# Patient Record
Sex: Male | Born: 1935 | Race: White | Hispanic: No | Smoking: Former smoker
Health system: Southern US, Community
[De-identification: ages and names within clinical notes are randomized; demographics above are authoritative.]

## PROBLEM LIST (undated history)

## (undated) DIAGNOSIS — C801 Malignant (primary) neoplasm, unspecified: Secondary | ICD-10-CM

## (undated) DIAGNOSIS — J449 Chronic obstructive pulmonary disease, unspecified: Secondary | ICD-10-CM

## (undated) DIAGNOSIS — I714 Abdominal aortic aneurysm, without rupture, unspecified: Secondary | ICD-10-CM

## (undated) DIAGNOSIS — N189 Chronic kidney disease, unspecified: Secondary | ICD-10-CM

## (undated) DIAGNOSIS — M1990 Unspecified osteoarthritis, unspecified site: Secondary | ICD-10-CM

## (undated) HISTORY — DX: Abdominal aortic aneurysm, without rupture, unspecified: I71.40

## (undated) HISTORY — DX: Chronic obstructive pulmonary disease, unspecified: J44.9

## (undated) HISTORY — DX: Abdominal aortic aneurysm, without rupture: I71.4

## (undated) HISTORY — PX: CATARACT EXTRACTION, BILATERAL: SHX1313

## (undated) HISTORY — DX: Malignant (primary) neoplasm, unspecified: C80.1

## (undated) HISTORY — DX: Unspecified osteoarthritis, unspecified site: M19.90

## (undated) HISTORY — DX: Chronic kidney disease, unspecified: N18.9

---

## 1981-07-04 HISTORY — PX: OTHER SURGICAL HISTORY: SHX169

## 1998-08-14 ENCOUNTER — Ambulatory Visit (HOSPITAL_COMMUNITY): Admission: RE | Admit: 1998-08-14 | Discharge: 1998-08-14 | Payer: Self-pay | Admitting: Gastroenterology

## 2003-08-18 ENCOUNTER — Encounter: Admission: RE | Admit: 2003-08-18 | Discharge: 2003-08-18 | Payer: Self-pay | Admitting: Emergency Medicine

## 2003-08-27 ENCOUNTER — Encounter: Admission: RE | Admit: 2003-08-27 | Discharge: 2003-08-27 | Payer: Self-pay | Admitting: Emergency Medicine

## 2005-04-19 ENCOUNTER — Ambulatory Visit: Payer: Self-pay | Admitting: Cardiology

## 2005-05-03 ENCOUNTER — Ambulatory Visit: Payer: Self-pay

## 2006-11-27 ENCOUNTER — Emergency Department (HOSPITAL_COMMUNITY): Admission: EM | Admit: 2006-11-27 | Discharge: 2006-11-27 | Payer: Self-pay | Admitting: Emergency Medicine

## 2006-12-06 ENCOUNTER — Ambulatory Visit (HOSPITAL_COMMUNITY): Admission: RE | Admit: 2006-12-06 | Discharge: 2006-12-06 | Payer: Self-pay | Admitting: Emergency Medicine

## 2006-12-07 ENCOUNTER — Encounter: Admission: RE | Admit: 2006-12-07 | Discharge: 2006-12-07 | Payer: Self-pay | Admitting: Emergency Medicine

## 2006-12-13 ENCOUNTER — Encounter: Admission: RE | Admit: 2006-12-13 | Discharge: 2006-12-13 | Payer: Self-pay | Admitting: Emergency Medicine

## 2008-09-19 ENCOUNTER — Encounter: Admission: RE | Admit: 2008-09-19 | Discharge: 2008-09-19 | Payer: Self-pay | Admitting: Internal Medicine

## 2008-10-16 ENCOUNTER — Ambulatory Visit (HOSPITAL_COMMUNITY): Admission: RE | Admit: 2008-10-16 | Discharge: 2008-10-16 | Payer: Self-pay | Admitting: Urology

## 2008-10-30 ENCOUNTER — Ambulatory Visit: Payer: Self-pay | Admitting: *Deleted

## 2009-02-26 ENCOUNTER — Encounter: Admission: RE | Admit: 2009-02-26 | Discharge: 2009-02-26 | Payer: Self-pay | Admitting: *Deleted

## 2009-02-26 ENCOUNTER — Ambulatory Visit: Payer: Self-pay | Admitting: Vascular Surgery

## 2009-08-13 ENCOUNTER — Ambulatory Visit: Payer: Self-pay | Admitting: Vascular Surgery

## 2010-03-01 ENCOUNTER — Ambulatory Visit: Payer: Self-pay | Admitting: Vascular Surgery

## 2010-07-26 ENCOUNTER — Encounter: Payer: Self-pay | Admitting: *Deleted

## 2010-11-16 NOTE — Assessment & Plan Note (Signed)
OFFICE VISIT   ELIZER, BOSTIC  DOB:  [birth year]                                       02/26/2009  ZOXWR#:[id number]   I saw the patient in the office today for continued followup of his  abdominal aortic aneurysm.  He has been followed by Dr. Madilyn Fireman.  This is  a pleasant 77 year old gentleman who on his most recent followup visit  it was noted that the aneurysm measured 5 cm in maximum diameter.  He  was set up for a 3 month followup CT scan.  He has a complicated medical  history in that he has a colostomy from previous colon cancer.  He also  has a tumor on the right kidney which is followed by Dr. Annabell Howells.  He also  has a history of continued tobacco abuse and smokes a pack per day of  cigarettes.  He denies any history of abdominal or back pain.   REVIEW OF SYSTEMS:  He has had no recent chest pain, chest pressure,  palpitations or arrhythmias.  He does admit to dyspnea on exertion.  He  has had no recent productive cough, bronchitis, asthma or wheezing.   PHYSICAL EXAMINATION:  General:  This is a pleasant 77 year old  gentleman who appears his stated age.  Vital signs:  His blood pressure  is 170/79, heart rate is 72.  HEENT:  Unremarkable.  Neck:  Neck is  supple.  I do not detect any carotid bruits.  Lungs:  Are clear  bilaterally to auscultation.  Cardiac:  He has a regular rate and  rhythm.  Abdomen:  Soft and nontender and his aneurysm is palpable and  nontender.  He has a colostomy.  He has palpable femoral and pedal  pulses bilaterally.  He has no evidence of atheroembolic disease.   I did review his CT scan from today and this does show that the aneurysm  today measures only 4.2 cm in maximum diameter.  Thus it is smaller than  originally noted.  He does have a right renal mass which is known.  This  has been slowly enlarging since the original study in February of 2005.   Of note, if this aneurysm ever enlarged enough to consider elective  repair certainly we would want to consider endovascular repair given his  colostomy and his age.  However, he does have a fair amount of thrombus  at the neck which may prohibit endovascular repair.   I have recommended a followup ultrasound in 6 months and I will see him  back at that time.  Certainly we would not consider elective repair  unless the aneurysm enlarged to greater than 5.5 cm.  We have also  discussed the importance of tobacco cessation.  Finally, I have asked  him to be sure to follow up with Dr. Annabell Howells who follows his renal tumor.   Di Kindle. Edilia Bo, M.D.  Electronically Signed   CSD/MEDQ  D:  02/26/2009  T:  02/27/2009  Job:  [id number]   cc:   Georgann Housekeeper, MD

## 2010-11-16 NOTE — Procedures (Signed)
DUPLEX ULTRASOUND OF ABDOMINAL AORTA   INDICATION:  Follow up known abdominal aortic aneurysm.   HISTORY:  Diabetes:  No.  Cardiac:  No.  Hypertension:  No.  Smoking:  Yes.  Connective Tissue Disorder:  Family History:  Previous Surgery:   DUPLEX EXAM:         AP (cm)                   TRANSVERSE (cm)  Proximal             2.86 cm                   2.87 cm  Mid                  3.75 cm                   3.62 cm  Distal               2.12 cm                   1.96 cm  Right Iliac          1.03 cm                   1.32 cm  Left Iliac           1.32 cm                   1.07 cm   PREVIOUS:  Date:  AP:  4.2 per CT  TRANSVERSE:   IMPRESSION:  Abdominal aortic aneurysm noted with the largest  measurement of 3.75 cm X 3.62 cm.   ___________________________________________  Di Kindle. Edilia Bo, M.D.   MG/MEDQ  D:  08/13/2009  T:  08/13/2009  Job:  [id number]

## 2010-11-16 NOTE — Procedures (Signed)
DUPLEX ULTRASOUND OF ABDOMINAL AORTA   INDICATION:  Follow up abdominal aortic aneurysm.   HISTORY:  Diabetes:  No.  Cardiac:  No.  Hypertension:  No.  Smoking:  Yes.  Connective Tissue Disorder:  Family History:  Family history of connective tissue disorder.  Previous Surgery:   DUPLEX EXAM:         AP (cm)                   TRANSVERSE (cm)  Proximal             3.22 cm                   3.06 cm  Mid                  3.58 cm                   3.55 cm  Distal               3.25 cm                   3.09 cm  Right Iliac          1.26 cm                   Not visualized  Left Iliac           0.85 cm                   Not visualized   PREVIOUS:  Date:  AP:  3.75  TRANSVERSE:  3.62   IMPRESSION:  Abdominal aortic aneurysm noted with largest measurement of  3.58 X 3.55 cm.   ___________________________________________  Di Kindle. Edilia Bo, M.D.   EM/MEDQ  D:  03/01/2010  T:  03/01/2010  Job:  [id number]

## 2010-11-16 NOTE — Consult Note (Signed)
VASCULAR SURGERY CONSULTATION   Shane [name], Shane [name]  DOB:  1920-01-23                                       10/30/2008  ZOXWR#:[id number]   REFERRING PHYSICIAN:  Georgann Housekeeper, MD   REFERRAL DIAGNOSIS:  Abdominal aortic aneurysm.   HISTORY:  The patient is a retired 77 year old gentleman who has a known  abdominal aortic aneurysm.  His most recent abdominal ultrasound was  carried out 10/16/2008 revealing his aneurysm measured 5 cm in maximal  diameter.  This compares to a CT scan performed in March of this year at  4.7 cm at maximal diameter.  Iliac arteries measure 1.4 on the right and  1.5 on the left.  The patient also has a solid mass emanating from the  lower pole of the right kidney followed by Dr. Annabell Howells.   The patient feels generally well.  He has no abdominal complaints.  He  underwent resection of a colon cancer and has a colostomy.   Cardiovascular risk factors include his advanced age and a history of  tobacco use.   PAST MEDICAL HISTORY:  1. COPD.  2. Colon CA.  3. Right kidney lesion.  4. Tobacco abuse.  5. BPH.   MEDICATIONS:  1. Aspirin 81 mg daily.  2. Multivitamin 1 tablet daily.   SOCIAL HISTORY:  The patient is widowed.  He has one son.  He is retired  from the [employer].  Smokes one to two cigarettes daily.  No  regular alcohol use.   FAMILY HISTORY:  Mother deceased age 75 and father deceased age 19,  complications of age.  Denies family history of cardiovascular disease,  stroke or diabetes.   REVIEW OF SYSTEMS:  Refer to patient encounter form.  The patient  describes arthritic joint discomfort.   PHYSICAL EXAM:  General:  A well-appearing 77 year old male.  Alert and  oriented.  No distress.  Vital signs:  BP 158/75, pulse 69 per minute.  HEENT:  Mouth and throat are clear.  Normocephalic.  Extraocular  movements intact.  Neck:  Supple.  No thyromegaly or adenopathy.  Chest:  Equal entry bilaterally.  Distant breath  sounds.  No rales or rhonchi.  Cardiovascular:  No carotid bruits.  Normal heart sounds without  murmurs.  No gallops or rubs.  Regular rate and rhythm.  Abdomen:  Soft,  nontender.  Left lower quadrant colostomy.  No hernias.  No organomegaly  or masses.  Lower extremities:  No ankle edema.  Full range of motion.  Femoral pulses 2+ bilaterally.  1+ popliteal, posterior tibial and  dorsalis pedis pulse.  Neurological:  Cranial nerves intact.  Strength  equal bilaterally.  1+ reflexes.  Skin:  Intact without rash or  ulceration.   IMPRESSION:  1. Five cm abdominal aortic aneurysm.  2. Colon cancer with colostomy.  3. Chronic obstructive pulmonary disease.  4. Right renal lesion.  5. Tobacco use.   RECOMMENDATIONS:  The patient has a 5 cm abdominal aortic aneurysm.  Recent evidence of growth.  Will plan followup in 3 months with CT scan.  The patient cautioned regarding the onset of any pain to contact the  office.  Chronic medical conditions well-controlled.  The patient  counseled regarding smoking cessation.   Balinda Quails, M.D.  Electronically Signed  PGH/MEDQ  D:  10/30/2008  T:  10/31/2008  Job:  [id number]   cc:   Georgann Housekeeper, MD

## 2011-03-02 ENCOUNTER — Ambulatory Visit (INDEPENDENT_AMBULATORY_CARE_PROVIDER_SITE_OTHER): Payer: Medicare Other

## 2011-03-02 DIAGNOSIS — I714 Abdominal aortic aneurysm, without rupture: Secondary | ICD-10-CM

## 2011-03-16 ENCOUNTER — Encounter: Payer: Self-pay | Admitting: Vascular Surgery

## 2011-03-16 NOTE — Procedures (Unsigned)
DUPLEX ULTRASOUND OF ABDOMINAL AORTA  INDICATION:  Abdominal aortic aneurysm.  HISTORY: Diabetes:  No. Cardiac:  No. Hypertension:  No. Smoking:  Yes. Connective Tissue Disorder: Family History:  Family history of connective tissue disorder. Previous Surgery:  No.  DUPLEX EXAM:         AP (cm)                   TRANSVERSE (cm) Proximal             2.6 cm                    2.4 cm Mid                  2.4 cm                    2.6 cm Distal               3.9 cm                    3.9 cm Right Iliac          1.6 cm                    1.7 cm Left Iliac           Not visualized            Not visualized  PREVIOUS:  Date:  03/01/2010  AP:  3.58  TRANSVERSE:  3.55  IMPRESSION: 1. Aneurysmal dilatation of the distal abdominal aorta with no     significant change in maximum diameter when compared to the     previous exam. 2. Unable to adequately visualize the left common iliac artery due to     overlying bowel gas patterns.  ___________________________________________ Di Kindle. Edilia Bo, M.D.  CH/MEDQ  D:  03/04/2011  T:  03/04/2011  Job:  [id number]

## 2011-09-01 ENCOUNTER — Other Ambulatory Visit: Payer: Self-pay | Admitting: Internal Medicine

## 2011-09-01 DIAGNOSIS — N289 Disorder of kidney and ureter, unspecified: Secondary | ICD-10-CM

## 2011-09-28 ENCOUNTER — Ambulatory Visit: Payer: Medicare Other | Admitting: Vascular Surgery

## 2011-09-28 ENCOUNTER — Other Ambulatory Visit: Payer: Medicare Other

## 2011-09-29 ENCOUNTER — Inpatient Hospital Stay: Admission: RE | Admit: 2011-09-29 | Payer: Medicare Other | Source: Ambulatory Visit

## 2012-01-16 ENCOUNTER — Telehealth: Payer: Self-pay | Admitting: Vascular Surgery

## 2012-01-16 NOTE — Telephone Encounter (Signed)
Message copied by Sara Chu on Mon Jan 16, 2012 11:43 AM ------      Message from: Marcellus Scott      Created: Mon Jan 16, 2012  9:40 AM      Contact: [phone]       Shane [name] wants [place] to call him back to res. His appointment. He can come any day except Wednesday.

## 2012-02-27 ENCOUNTER — Encounter: Payer: Self-pay | Admitting: Vascular Surgery

## 2012-02-29 ENCOUNTER — Ambulatory Visit: Payer: Medicare Other | Admitting: Vascular Surgery

## 2012-03-02 ENCOUNTER — Encounter: Payer: Self-pay | Admitting: Neurosurgery

## 2012-03-06 ENCOUNTER — Ambulatory Visit (INDEPENDENT_AMBULATORY_CARE_PROVIDER_SITE_OTHER): Payer: Medicare Other | Admitting: *Deleted

## 2012-03-06 ENCOUNTER — Encounter: Payer: Self-pay | Admitting: Neurosurgery

## 2012-03-06 ENCOUNTER — Ambulatory Visit (INDEPENDENT_AMBULATORY_CARE_PROVIDER_SITE_OTHER): Payer: Medicare Other | Admitting: Neurosurgery

## 2012-03-06 VITALS — BP 158/68 | HR 58 | Resp 12 | Ht 68.0 in | Wt 153.5 lb

## 2012-03-06 DIAGNOSIS — I714 Abdominal aortic aneurysm, without rupture, unspecified: Secondary | ICD-10-CM

## 2012-03-06 NOTE — Addendum Note (Signed)
Addended by: Sharee Pimple on: 03/06/2012 09:39 AM   Modules accepted: Orders

## 2012-03-06 NOTE — Progress Notes (Signed)
[facility] [place] AAA/PAD/PVD Office Note  CC: Annual AAA duplex Referring Physician: Edilia Bo  History of Present Illness: 78 year old male patient of Dr. Edilia Bo followed for known AAA. The patient denies any unusual abdominal or back pain. Patient has no other vascular issues, no new medical diagnoses or recent surgery.  Past Medical History  Diagnosis Date  . AAA (abdominal aortic aneurysm)   . COPD (chronic obstructive pulmonary disease)   . Cancer     Colon cancer  . Chronic kidney disease   . Arthritis     ROS: [x]  Positive   [ ]  Denies    General: [ ]  Weight loss, [ ]  Fever, [ ]  chills Neurologic: [ ]  Dizziness, [ ]  Blackouts, [ ]  Seizure [ ]  Stroke, [ ]  "Mini stroke", [ ]  Slurred speech, [ ]  Temporary blindness; [ ]  weakness in arms or legs, [ ]  Hoarseness Cardiac: [ ]  Chest pain/pressure, [ ]  Shortness of breath at rest [ ]  Shortness of breath with exertion, [ ]  Atrial fibrillation or irregular heartbeat Vascular: [ ]  Pain in legs with walking, [ ]  Pain in legs at rest, [ ]  Pain in legs at night,  [ ]  Non-healing ulcer, [ ]  Blood clot in vein/DVT,   Pulmonary: [ ]  Home oxygen, [ ]  Productive cough, [ ]  Coughing up blood, [ ]  Asthma,  [ ]  Wheezing Musculoskeletal:  [ ]  Arthritis, [ ]  Low back pain, [ ]  Joint pain Hematologic: [ ]  Easy Bruising, [ ]  Anemia; [ ]  Hepatitis Gastrointestinal: [ ]  Blood in stool, [ ]  Gastroesophageal Reflux/heartburn, [ ]  Trouble swallowing Urinary: [ ]  chronic Kidney disease, [ ]  on HD - [ ]  MWF or [ ]  TTHS, [ ]  Burning with urination, [ ]  Difficulty urinating Skin: [ ]  Rashes, [ ]  Wounds Psychological: [ ]  Anxiety, [ ]  Depression   Social History History  Substance Use Topics  . Smoking status: Current Everyday Smoker  . Smokeless tobacco: Not on file  . Alcohol Use: No    Family History Family History  Problem Relation Age of Onset  . Heart disease Father     Not on File  Current Outpatient  Prescriptions  Medication Sig Dispense Refill  . aspirin 81 MG tablet Take 81 mg by mouth daily.      . Multiple Vitamin (MULTIVITAMIN WITH MINERALS) TABS Take 1 tablet by mouth daily.        Physical Examination  Filed Vitals:   03/06/12 0920  BP: 158/68  Pulse: 58  Resp: 12    Body mass index is 23.34 kg/(m^2).  General:  WDWN in NAD Gait: Normal HEENT: WNL Eyes: Pupils equal Pulmonary: normal non-labored breathing , without Rales, rhonchi,  wheezing Cardiac: RRR, without  Murmurs, rubs or gallops; No carotid bruits Abdomen: soft, NT, no masses Skin: no rashes, ulcers noted Vascular Exam/Pulses: Palpable femoral pulses bilaterally, no abdominal mass is palpated  Extremities without ischemic changes, no Gangrene , no cellulitis; no open wounds;  Musculoskeletal: no muscle wasting or atrophy  Neurologic: A&O X 3; Appropriate Affect ; SENSATION: normal; MOTOR FUNCTION:  moving all extremities equally. Speech is fluent/normal  Non-Invasive Vascular Imaging: AAA duplex today shows a maximum diameter of 3.7 x 3.7 which is a slight decrease from previous one year ago when he was 3.9  ASSESSMENT/PLAN: Asymptomatic AAA, patient will followup in one year with repeat AAA duplex. The patient's questions were encouraged and answered, he is in agreement with this plan.  Lauree Chandler ANP  Clinic M.D.: Early

## 2013-03-05 ENCOUNTER — Encounter: Payer: Self-pay | Admitting: Family

## 2013-03-06 ENCOUNTER — Encounter (INDEPENDENT_AMBULATORY_CARE_PROVIDER_SITE_OTHER): Payer: Medicare Other | Admitting: *Deleted

## 2013-03-06 ENCOUNTER — Ambulatory Visit (INDEPENDENT_AMBULATORY_CARE_PROVIDER_SITE_OTHER): Payer: Medicare Other | Admitting: Family

## 2013-03-06 ENCOUNTER — Encounter: Payer: Self-pay | Admitting: Family

## 2013-03-06 VITALS — BP 133/72 | HR 72 | Resp 16 | Ht 67.0 in | Wt 138.0 lb

## 2013-03-06 DIAGNOSIS — I714 Abdominal aortic aneurysm, without rupture, unspecified: Secondary | ICD-10-CM

## 2013-03-06 NOTE — Patient Instructions (Signed)
Abdominal Aortic Aneurysm  An aneurysm is the enlargement (dilatation), bulging, or ballooning out of part of the wall of a vein or artery. An aortic aneurysm is a bulging in the largest artery of the body. This artery supplies blood from the heart to the rest of the body.  The first part of the aorta is called the thoracic aorta. It leaves the heart, rises (ascends), arches, and goes down (descends) through the chest until it reaches the diaphragm. The diaphragm is the muscular part between the chest and abdomen.  The second part of the aorta is called the abdominal aorta after it has passed the diaphragm and continues down through the abdomen. The abdominal aorta ends where it splits to form the two iliac arteries that go to the legs. Aortic aneurysms can develop anywhere along the length of the aorta. The majority are located along the abdominal aorta. The major concern with an aortic aneurysm is that it can enlarge and rupture. This can cause death unless diagnosed and treated promptly. Aneurysms can also develop blood clots or infections. CAUSES  Many aortic aneurysms are caused by arteriosclerosis. Arteriosclerosis can weaken the aortic wall. The pressure of the blood being pumped through the aorta causes it to balloon out at the site of weakness. Therefore, high blood pressure (hypertension) is associated with aneurysm. Other risk factors include:  Age over 60.  Tobacco use.  Being male.  White race.  Family history of aneurysm.  Less frequent causes of abdominal aortic aneurysms include:  Connective tissue diseases.  Abdominal trauma.  Inflammation of blood vessles (arteritis).  Inherited (congenital) malformations.  Infection. SYMPTOMS  The signs and symptoms of an unruptured aneurysm will partly depend on its size and rate of growth.   Abdominal aortic aneurysms may cause pain. The pain typically has a deep quality as if it is piercing into the person. It is felt most  often in the lower back area. The pain is usually steady but may be relieved by changing your body position.  The person may also become aware of an abnormally prominent pulse in the belly (abdominal pulsation). DIAGNOSIS  An aortic aneurysm may be discovered by chance on physical exam, or on X-ray studies done for other reasons. It may be suspected because of other problems such as back or abdominal pain. The following tests may help identify the problem.  X-rays of the abdomen can show calcium deposits in the aneurysm wall.  CT scanning of the abdomen, particularly with contrast medium, is accurate at showing the exact size and shape of the aneurysm.  Ultrasounds give a clear picture of the size of an aneurysm (about 98% accuracy).  MRI scanning is accurate, but often unnecessary.  An abdominal angiogram shows the source of the major blood vessels arising from the aorta. It reveals the size and extent of any aneurysm. It can also show a clot clinging to the wall of the aneurysm (mural thrombus). TREATMENT  Treating an abdominal aortic aneurysm depends on the size. A rupture of an aneurysm is uncommon when they are less than 5 cm wide (2 inches). Rupture is far more common in aneurysms that are over 6 cm wide (2.4 inches).  Surgical repair is usually recommended for all aneurysms over 6 cm wide (2.4 inches). This depends on the health, age, and other circumstances of the individual. This type of surgery consists of opening the abdomen, removing the aneurysm, and sewing a synthetic graft (similar to a cloth tube) in its place. A   less invasive form of this surgery, using stent grafts, is sometimes recommended.  For most patients, elective repair is recommended for aneurysms between 4 and 6 cm (1.6 and 2.4 inches). Elective means the surgery can be done at your convenience. This should not be put off too long if surgery is recommended.  If you smoke, stop immediately. Smoking is a major risk  factor for enlargement and rupture.  Medications may be used to help decrease complications  these include medicine to lower blood pressure and control cholesterol. HOME CARE INSTRUCTIONS   If you smoke, stop. Do not start smoking.  Take all medications as prescribed.  Your caregiver will tell you when to have your aneurysm rechecked, either by ultrasound or CT scan.  If your caregiver has given you a follow-up appointment, it is very important to keep that appointment. Not keeping the appointment could result in a chronic or permanent injury, pain, or disability. If there is any problem keeping the appointment, you must call back to this facility for assistance. SEEK MEDICAL CARE IF:   You develop mild abdominal pain or pressure.  You are able to feel or perceive your aneurysm, and you sense any change. SEEK IMMEDIATE MEDICAL CARE IF:   You develop severe abdominal pain, or severe pain moving (radiating) to your back.  You suddenly develop cold or blue toes or feet.  You suddenly develop lightheadedness or fainting spells. MAKE SURE YOU:   Understand these instructions.  Will watch your condition.  Will get help right away if you are not doing well or get worse. Document Released: 03/30/2005 Document Revised: 09/12/2011 Document Reviewed: 01/22/2008 Exit[employer] Patient Information 2014 [employer].  

## 2013-03-06 NOTE — Progress Notes (Signed)
[facility] [place]  Established Abdominal Aortic Aneurysm  History of Present Illness  Shane [name] is a 79 y.o. ([birth year]) male patient who had been followed by Dr. Edilia Bo and presents for yearly surveillance follow up for AAA.  Previous studies (Sept., 2013) demonstrate an AAA, measuring 3.7 cm x 3.7 cm.  The patient does not have back or abdominal pain.  The patient is a former smoker. 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 He reports that he does lots of walking, uses his weed eater and mows his lawn. He states he was diagnosed with diabetes about 3 years ago and takes 2 oral medications for this; he will call our office when he gets homes with the names of the medications.  Pt Diabetic: Yes, states in good control Pt smoker: former smoker, quit 8 years ago  Past Medical History  Diagnosis Date  . AAA (abdominal aortic aneurysm)   . COPD (chronic obstructive pulmonary disease)   . Cancer     Colon cancer  . Chronic kidney disease   . Arthritis    No past surgical history on file. Social History History   Social History  . Marital Status: Widowed    Spouse Name: N/A    Number of Children: N/A  . Years of Education: N/A   Occupational History  . Not on file.   Social History Main Topics  . Smoking status: Current Every Day Smoker  . Smokeless tobacco: Not on file  . Alcohol Use: No  . Drug Use: No  . Sexual Activity:    Other Topics Concern  . Not on file   Social History Narrative  . No narrative on file   Family History Family History  Problem Relation Age of Onset  . Heart disease Father     Current Outpatient Prescriptions on File Prior to Visit  Medication Sig Dispense Refill  . aspirin 81 MG tablet Take 81 mg by mouth daily.      . Multiple Vitamin (MULTIVITAMIN WITH MINERALS) TABS Take 1 tablet by mouth daily.       No current facility-administered medications on file prior to visit.   Not on  File  ROS: [x]  Positive   [ ]  Negative   [ ]  All sytems reviewed and are negative  General: Arly.Keller ] Weight loss with recent food poisoning, lost 15 pounds [ ]  Fever, [ ]  chills Neurologic: [ ]  Dizziness, [ ]  Blackouts, [ ]  Seizure [ ]  Stroke, [ ]  "Mini stroke", [ ]  Slurred speech, [ ]  Temporary blindness; [ ]  weakness in arms or legs, [ ]  Hoarseness Cardiac: [ ]  Chest pain/pressure, [ ]  Shortness of breath at rest [ ]  Shortness of breath with exertion, [ ]  Atrial fibrillation or irregular heartbeat Vascular: [ ]  Pain in legs with walking, [ ]  Pain in legs at rest, [ ]  Pain in legs at night,  [ ]  Non-healing ulcer, [ ]  Blood clot in vein/DVT,   Pulmonary: [ ]  Home oxygen, [ ]  Productive cough, [ ]  Coughing up blood, [ ]  Asthma,  [ ]  Wheezing Musculoskeletal:  [ ]  Arthritis, [ ]  Low back pain, [ ]  Joint pain Hematologic: [ ]  Easy Bruising, [ ]  Anemia; [ ]  Hepatitis Gastrointestinal: [ ]  Blood in stool, [ ]  Gastroesophageal Reflux/heartburn, [ ]  Trouble swallowing Urinary: [ ]  chronic Kidney disease, [ ]  on HD - [ ]  MWF or [ ]  TTHS, [ ]  Burning with urination, [ ]   Difficulty urinating Skin: [ ]  Rashes, [ ]  Wounds Psychological: [ ]  Anxiety, [ ]  Depression  Physical Examination  Filed Vitals:   03/06/13 0913  BP: 133/72  Pulse: 72  Resp: 16   Body mass index is 21.61 kg/(m^2).  General: A&O x 3, WD, fit appearing for age 86.  Pulmonary: Sym exp, good air movt, CTAB, no rales, rhonchi, & wheezing.  Cardiac: RRR, Nl S1, S2, no Murmurs, rubs or gallops.  Carotid Bruits Left Right   Negative Positive                             VASCULAR EXAM:                                                                                                         LE Pulses LEFT RIGHT       FEMORAL   palpable   palpable        POPLITEAL   palpable    palpable       POSTERIOR TIBIAL   palpable    palpable        DORSALIS PEDIS      ANTERIOR TIBIAL  palpable    palpable      Gastrointestinal:  soft, NTND, -G/R, - HSM, - masses, - CVAT B.  Musculoskeletal: M/S 5/5 throughout. Extremities without ischemic changes.  Neurologic: CN 2-12 intact. Pain and light touch intact in extremities. Motor exam as listed above. Hard of hearing, wearing hearing aids.  Non-Invasive Vascular Imaging  AAA Duplex (03/06/2013)  Previous size: 3.9 x 3.7  cm (Date: 03/06/2012), 3.9 x 3.9 cm on 03/02/2011.  Current size:  3.9 cm x 3.7 cm(Date: 03/06/2013)  Medical Decision Making  The patient is a 79 y.o. male who presents with: asymptomatic AAA with no increase in size. Right carotid bruit was auscultated, no previous carotid Duplex on file, but he has no history of any TIA or stroke symptoms. Patient left before he could be asked to stay for carotid US, will try to schedule for as soon as possible.   After discussing with Dr. Edilia Bo, and based on this patient's exam and diagnostic studies, he needs bilateral carotid Duplex as soon as possible.  The threshold for repair is AAA size > 5.5 cm, growth > 1 cm/yr, and symptomatic status.  The patient will follow up in 1 year with the following studies: AAA Duplex.    I emphasized the importance of maximal medical management including strict control of blood pressure, blood glucose, and lipid levels, antiplatelet agents, obtaining regular exercise, and continued cessation of smoking.    Thank you for allowing [place] to participate in this patient's care.  Charisse March, RN, MSN, FNP-C [facility] [place] Office: [phone]  Clinic Physician: Edilia Bo  03/06/2013, 9:11 AM

## 2013-03-14 ENCOUNTER — Other Ambulatory Visit (INDEPENDENT_AMBULATORY_CARE_PROVIDER_SITE_OTHER): Payer: Medicare Other | Admitting: Vascular Surgery

## 2013-03-14 ENCOUNTER — Other Ambulatory Visit: Payer: Self-pay | Admitting: *Deleted

## 2013-03-14 DIAGNOSIS — R0989 Other specified symptoms and signs involving the circulatory and respiratory systems: Secondary | ICD-10-CM

## 2013-03-15 ENCOUNTER — Encounter: Payer: Self-pay | Admitting: Vascular Surgery

## 2013-03-20 ENCOUNTER — Other Ambulatory Visit: Payer: Self-pay | Admitting: Gastroenterology

## 2013-03-20 DIAGNOSIS — R112 Nausea with vomiting, unspecified: Secondary | ICD-10-CM

## 2013-03-21 ENCOUNTER — Other Ambulatory Visit: Payer: Self-pay | Admitting: Internal Medicine

## 2013-03-21 DIAGNOSIS — R109 Unspecified abdominal pain: Secondary | ICD-10-CM

## 2013-03-26 ENCOUNTER — Ambulatory Visit
Admission: RE | Admit: 2013-03-26 | Discharge: 2013-03-26 | Disposition: A | Discharge status: Home / Self Care | Payer: Medicare Other | Source: Ambulatory Visit | Attending: Internal Medicine | Admitting: Internal Medicine

## 2013-03-26 DIAGNOSIS — R109 Unspecified abdominal pain: Secondary | ICD-10-CM

## 2013-03-26 MED ORDER — IOHEXOL 300 MG/ML  SOLN
100.0000 mL | Freq: Once | INTRAMUSCULAR | Status: AC | PRN
  Administered 2013-03-26: 100 mL via INTRAVENOUS

## 2013-03-27 ENCOUNTER — Other Ambulatory Visit (HOSPITAL_COMMUNITY): Payer: Self-pay | Admitting: Internal Medicine

## 2013-03-27 DIAGNOSIS — K769 Liver disease, unspecified: Secondary | ICD-10-CM

## 2013-03-29 ENCOUNTER — Encounter (HOSPITAL_COMMUNITY): Payer: Self-pay | Admitting: Pharmacy Technician

## 2013-03-29 ENCOUNTER — Other Ambulatory Visit: Payer: Self-pay | Admitting: Radiology

## 2013-04-01 ENCOUNTER — Other Ambulatory Visit: Payer: Medicare Other

## 2013-04-02 ENCOUNTER — Encounter (HOSPITAL_COMMUNITY): Payer: Self-pay

## 2013-04-02 ENCOUNTER — Ambulatory Visit (HOSPITAL_COMMUNITY)
Admission: RE | Admit: 2013-04-02 | Discharge: 2013-04-02 | Disposition: A | Discharge status: Home / Self Care | Payer: Medicare Other | Source: Ambulatory Visit | Attending: Internal Medicine | Admitting: Internal Medicine

## 2013-04-02 DIAGNOSIS — K7689 Other specified diseases of liver: Secondary | ICD-10-CM | POA: Insufficient documentation

## 2013-04-02 DIAGNOSIS — C189 Malignant neoplasm of colon, unspecified: Secondary | ICD-10-CM | POA: Insufficient documentation

## 2013-04-02 DIAGNOSIS — I714 Abdominal aortic aneurysm, without rupture, unspecified: Secondary | ICD-10-CM | POA: Insufficient documentation

## 2013-04-02 DIAGNOSIS — N189 Chronic kidney disease, unspecified: Secondary | ICD-10-CM | POA: Insufficient documentation

## 2013-04-02 DIAGNOSIS — Z85038 Personal history of other malignant neoplasm of large intestine: Secondary | ICD-10-CM | POA: Insufficient documentation

## 2013-04-02 DIAGNOSIS — J4489 Other specified chronic obstructive pulmonary disease: Secondary | ICD-10-CM | POA: Insufficient documentation

## 2013-04-02 DIAGNOSIS — M129 Arthropathy, unspecified: Secondary | ICD-10-CM | POA: Insufficient documentation

## 2013-04-02 DIAGNOSIS — R109 Unspecified abdominal pain: Secondary | ICD-10-CM | POA: Insufficient documentation

## 2013-04-02 DIAGNOSIS — C787 Secondary malignant neoplasm of liver and intrahepatic bile duct: Secondary | ICD-10-CM | POA: Insufficient documentation

## 2013-04-02 DIAGNOSIS — K769 Liver disease, unspecified: Secondary | ICD-10-CM

## 2013-04-02 DIAGNOSIS — J449 Chronic obstructive pulmonary disease, unspecified: Secondary | ICD-10-CM | POA: Insufficient documentation

## 2013-04-02 DIAGNOSIS — R634 Abnormal weight loss: Secondary | ICD-10-CM | POA: Insufficient documentation

## 2013-04-02 LAB — CBC
HCT: 40.5 % (ref 39.0–52.0)
Hemoglobin: 13.9 g/dL (ref 13.0–17.0)
MCV: 92.7 fL (ref 78.0–100.0)
Platelets: 400 10*3/uL (ref 150–400)
RBC: 4.37 MIL/uL (ref 4.22–5.81)
RDW: 13.7 % (ref 11.5–15.5)
WBC: 12.5 10*3/uL — ABNORMAL HIGH (ref 4.0–10.5)

## 2013-04-02 LAB — GLUCOSE, CAPILLARY: Glucose-Capillary: 117 mg/dL — ABNORMAL HIGH (ref 70–99)

## 2013-04-02 MED ORDER — MIDAZOLAM HCL 2 MG/2ML IJ SOLN
INTRAMUSCULAR | Status: AC | PRN
  Administered 2013-04-02: 0.5 mg via INTRAVENOUS
  Administered 2013-04-02: 1 mg via INTRAVENOUS

## 2013-04-02 MED ORDER — HYDROCODONE-ACETAMINOPHEN 5-325 MG PO TABS: 1.0000 | ORAL_TABLET | ORAL | Status: DC | PRN

## 2013-04-02 MED ORDER — FENTANYL CITRATE 0.05 MG/ML IJ SOLN
INTRAMUSCULAR | Status: AC | PRN
  Administered 2013-04-02: 25 ug via INTRAVENOUS

## 2013-04-02 MED ORDER — SODIUM CHLORIDE 0.9 % IV SOLN
Freq: Once | INTRAVENOUS | Status: AC
  Administered 2013-04-02: 20 mL/h via INTRAVENOUS

## 2013-04-02 MED ORDER — FENTANYL CITRATE 0.05 MG/ML IJ SOLN
INTRAMUSCULAR | Status: AC
  Filled 2013-04-02: qty 2

## 2013-04-02 MED ORDER — MIDAZOLAM HCL 2 MG/2ML IJ SOLN
INTRAMUSCULAR | Status: AC
  Filled 2013-04-02: qty 2

## 2013-04-02 NOTE — Progress Notes (Signed)
BS this afternoon was 117mg /dl

## 2013-04-02 NOTE — H&P (Signed)
Shane [name] is an 79 y.o. male.   Chief Complaint: abdominal pain x several weeks CT 03/26/2013 reveals Liver lesion Hx colon ca Scheduled now for liver lesion biopsy  HPI: Colon Ca; AAA; COPD  Past Medical History  Diagnosis Date  . AAA (abdominal aortic aneurysm)   . COPD (chronic obstructive pulmonary disease)   . Cancer     Colon cancer  . Chronic kidney disease   . Arthritis     History reviewed. No pertinent past surgical history.  Family History  Problem Relation Age of Onset  . Heart disease Father    Social History:  reports that he has been smoking.  He has never used smokeless tobacco. He reports that he does not drink alcohol or use illicit drugs.  Allergies: No Known Allergies   (Not in [name] hospital admission)  No results found for this or any previous visit (from the past 48 hour(s)). No results found.  Review of Systems  Constitutional: Positive for weight loss. Negative for fever.  Respiratory: Negative for cough and shortness of breath.   Cardiovascular: Negative for chest pain.  Gastrointestinal: Positive for abdominal pain. Negative for nausea and vomiting.  Musculoskeletal: Positive for back pain.  Neurological: Negative for weakness.  Psychiatric/Behavioral: Positive for substance abuse.       Smoker    Blood pressure 145/82, pulse 105, temperature 97.7 F (36.5 C), temperature source Oral, resp. rate 18, height 5\' 7"  (1.702 m), weight 130 lb (58.968 kg), SpO2 99.00%. Physical Exam  Constitutional: He is oriented to person, place, and time.  thin  Cardiovascular: Normal rate, regular rhythm and normal heart sounds.   No murmur heard. Respiratory: Effort normal and breath sounds normal. He has no wheezes.  GI: Soft. Bowel sounds are normal. There is tenderness.  Musculoskeletal: Normal range of motion.  Neurological: He is alert and oriented to person, place, and time.  Skin: Skin is warm and dry.  Psychiatric: He has [name] normal mood and  affect. His behavior is normal. Judgment and thought content normal.     Assessment/Plan abd pain x weeks CT shows liver lesion Hx colon ca Scheduled now for liver lesion bx Pt aware of procedure benefits and risks and agreeable to proceed Consent signed and in chart  Shane [name] 04/02/2013, 1:05 PM

## 2013-04-02 NOTE — Progress Notes (Signed)
Blood for STAT CBC, PTT, PT, and INR sent to STAT Lab.

## 2013-04-02 NOTE — Procedures (Signed)
US guided core biopsies of right hepatic lesion.  No immediate complication.

## 2013-04-08 ENCOUNTER — Telehealth: Payer: Self-pay | Admitting: Oncology

## 2013-04-08 ENCOUNTER — Telehealth: Payer: Self-pay | Admitting: *Deleted

## 2013-04-08 NOTE — Telephone Encounter (Signed)
PT SCHEDULED TO SEE DR. SHERRILL 10/16 @ 1:30.  WELCOME PACKET MAILED.

## 2013-04-08 NOTE — Telephone Encounter (Signed)
Spoke with patient's son and confirmed appointment with Dr. Darrold Span for 04/15/13.  Contact names and phone numbers were provided.  Will ask dietician to try to see patient on same date after patient has seen Dr. Darrold Span.

## 2013-04-09 ENCOUNTER — Telehealth: Payer: Self-pay | Admitting: Oncology

## 2013-04-09 NOTE — Telephone Encounter (Signed)
C/D 04/09/13 for appt. 04/15/13

## 2013-04-09 NOTE — Telephone Encounter (Signed)
lvm for pt regarding to NUT appt b4 visits

## 2013-04-14 ENCOUNTER — Other Ambulatory Visit: Payer: Self-pay | Admitting: Oncology

## 2013-04-14 DIAGNOSIS — C259 Malignant neoplasm of pancreas, unspecified: Secondary | ICD-10-CM

## 2013-04-14 DIAGNOSIS — C787 Secondary malignant neoplasm of liver and intrahepatic bile duct: Secondary | ICD-10-CM

## 2013-04-14 DIAGNOSIS — C189 Malignant neoplasm of colon, unspecified: Secondary | ICD-10-CM

## 2013-04-15 ENCOUNTER — Ambulatory Visit (HOSPITAL_BASED_OUTPATIENT_CLINIC_OR_DEPARTMENT_OTHER): Payer: Medicare Other

## 2013-04-15 ENCOUNTER — Ambulatory Visit (HOSPITAL_BASED_OUTPATIENT_CLINIC_OR_DEPARTMENT_OTHER): Payer: Medicare Other | Admitting: Lab

## 2013-04-15 ENCOUNTER — Encounter: Payer: Medicare Other | Admitting: Nutrition

## 2013-04-15 ENCOUNTER — Ambulatory Visit: Payer: Medicare Other

## 2013-04-15 ENCOUNTER — Ambulatory Visit (HOSPITAL_BASED_OUTPATIENT_CLINIC_OR_DEPARTMENT_OTHER): Payer: Medicare Other | Admitting: Oncology

## 2013-04-15 ENCOUNTER — Telehealth: Payer: Self-pay | Admitting: *Deleted

## 2013-04-15 ENCOUNTER — Other Ambulatory Visit: Payer: Self-pay | Admitting: Gastroenterology

## 2013-04-15 ENCOUNTER — Encounter: Payer: Self-pay | Admitting: Oncology

## 2013-04-15 ENCOUNTER — Ambulatory Visit: Payer: Medicare Other | Admitting: Nutrition

## 2013-04-15 VITALS — BP 112/61 | HR 107 | Temp 97.5°F | Resp 17 | Ht 67.0 in | Wt 121.3 lb

## 2013-04-15 VITALS — BP 112/77 | HR 120 | Temp 96.9°F | Resp 16

## 2013-04-15 DIAGNOSIS — E119 Type 2 diabetes mellitus without complications: Secondary | ICD-10-CM

## 2013-04-15 DIAGNOSIS — C801 Malignant (primary) neoplasm, unspecified: Secondary | ICD-10-CM

## 2013-04-15 DIAGNOSIS — C787 Secondary malignant neoplasm of liver and intrahepatic bile duct: Secondary | ICD-10-CM

## 2013-04-15 DIAGNOSIS — Z85038 Personal history of other malignant neoplasm of large intestine: Secondary | ICD-10-CM

## 2013-04-15 DIAGNOSIS — C189 Malignant neoplasm of colon, unspecified: Secondary | ICD-10-CM

## 2013-04-15 DIAGNOSIS — C259 Malignant neoplasm of pancreas, unspecified: Secondary | ICD-10-CM

## 2013-04-15 DIAGNOSIS — J449 Chronic obstructive pulmonary disease, unspecified: Secondary | ICD-10-CM

## 2013-04-15 DIAGNOSIS — K311 Adult hypertrophic pyloric stenosis: Secondary | ICD-10-CM

## 2013-04-15 LAB — COMPREHENSIVE METABOLIC PANEL (CC13)
ALT: 104 U/L — ABNORMAL HIGH (ref 0–55)
AST: 50 U/L — ABNORMAL HIGH (ref 5–34)
Anion Gap: 13 mEq/L — ABNORMAL HIGH (ref 3–11)
CO2: 22 mEq/L (ref 22–29)
Calcium: 10.4 mg/dL (ref 8.4–10.4)
Chloride: 98 mEq/L (ref 98–109)
Creatinine: 1.7 mg/dL — ABNORMAL HIGH (ref 0.7–1.3)
Potassium: 5.2 mEq/L — ABNORMAL HIGH (ref 3.5–5.1)
Sodium: 134 mEq/L — ABNORMAL LOW (ref 136–145)
Total Protein: 7.2 g/dL (ref 6.4–8.3)

## 2013-04-15 LAB — CBC WITH DIFFERENTIAL/PLATELET
BASO%: 0.2 % (ref 0.0–2.0)
EOS%: 0.1 % (ref 0.0–7.0)
Eosinophils Absolute: 0 10*3/uL (ref 0.0–0.5)
HCT: 41.5 % (ref 38.4–49.9)
HGB: 13.8 g/dL (ref 13.0–17.1)
LYMPH%: 6.1 % — ABNORMAL LOW (ref 14.0–49.0)
MCH: 31.3 pg (ref 27.2–33.4)
MCHC: 33.2 g/dL (ref 32.0–36.0)
MONO#: 1 10*3/uL — ABNORMAL HIGH (ref 0.1–0.9)
NEUT#: 14.2 10*3/uL — ABNORMAL HIGH (ref 1.5–6.5)
NEUT%: 87.3 % — ABNORMAL HIGH (ref 39.0–75.0)
RDW: 13.8 % (ref 11.0–14.6)
WBC: 16.2 10*3/uL — ABNORMAL HIGH (ref 4.0–10.3)
lymph#: 1 10*3/uL (ref 0.9–3.3)

## 2013-04-15 MED ORDER — SODIUM CHLORIDE 0.9 % IV SOLN
4.0000 mg | Freq: Once | INTRAVENOUS | Status: AC
  Administered 2013-04-15: 4 mg via INTRAVENOUS
  Filled 2013-04-15: qty 2

## 2013-04-15 MED ORDER — SODIUM CHLORIDE 0.9 % IV SOLN
INTRAVENOUS | Status: DC
  Administered 2013-04-15: 15:00:00 via INTRAVENOUS

## 2013-04-15 MED ORDER — ONDANSETRON 8 MG PO TBDP: 8.0000 mg | ORAL_TABLET | Freq: Two times a day (BID) | ORAL | Status: AC | PRN

## 2013-04-15 NOTE — Progress Notes (Signed)
Checked in new pt with no financial concerns. °

## 2013-04-15 NOTE — Progress Notes (Signed)
[facility] NEW PATIENT EVALUATION   Name: Shane [name] Date: 04/15/2013 MRN: [id number] DOB: 10/19/1919  REFERRING PHYSICIAN: Georgann Housekeeper, MD CC: Danise Edge, Waverly Ferrari    REASON FOR REFERRAL: metastatic pancreatic cancer to liver   HISTORY OF PRESENT ILLNESS:Shane [name] is a 79 y.o. male who is seen in consultation, together with son, at the request of  PCP Dr Donette Larry, with new diagnosis of metastatic adenocarcinoma to liver and radiographic evidence of new pancreatic caner. Past history is significant for colon cancer 1993 treated with surgery with colostomy, but no adjuvant systemic therapy.   Patient seemed in his usual fairly good health, including driving cars to auto auction, until early August 2014 when he developed pain RLQ abdomen and vomiting. Symptoms progressed and he had CT AP with contrast 03-26-13, which showed 5.2.4 cm pancreatic tail mass with obstruction of splenic vein, ascites and peritoneal infiltration, liver lesions up to 2 cm and stomach/duodenum dilated to ligament of Treitz, all new compared with 2010; also by that CT stable 3.3 cm right renal mass, prior distal colon resection and AAA 4x3.6 cm. He had US biopsy of right liver lesion 04-02-13, with path [phone] positive for adenocarcinoma with staining favoring pancreaticobiliary or GI primary. Patient has lost 30 lbs since early August, vomiting several times daily and probably keeping down very little at all, tho he tries to drink 2 Ensure daily and sometimes does not vomit for 2-3 hours after po's. He is using oxycodone 1/2 of 5 mg tab in AM and one tab at hs, which does not seem to worsen the nausea/ vomiting. He is sleeping only ~ 2-3 hours per night. Bowels moved small amount this AM. He is voiding some. He is still staying at his home, with son assisting thru day. He denies epigastric or midback pain. He has not tried nausea medications.  REVIEW OF SYSTEMS as above, also: No fever or  symptoms of infection. No bleeding. Usual weight for years 150 lbs, until this weight loss. No HA. Wears reading glasses. Decreased hearing since WWII injury. Dentures. No thyroid disease known. No LE swelling. Can only lie on left side. Blood sugar 117 this AM, only checks once daily.  Remainder of full 10 point review of systems negative.   ALLERGIES: Review of patient's allergies indicates no known allergies.  PAST MEDICAL/ SURGICAL HISTORY:    Colon cancer 1993 treated with surgical resection with colostomy. Patient does not recall surgeon's name and that information is not available in present EMR. COPD with long past tobacco, DCd 2006. Diabetes x 3 years, oral medications decreased last week due to poor po intake. AAA Arthritis hands CKD  CURRENT MEDICATIONS: reviewed as listed now in EMR Will try zofran ODT, tho most of nausea/ vomiting may be from gastric outlet/ duodenal obstruction.  Fish farm manager at [employer]   SOCIAL HISTORY: From [employer]. Widowed, lives alone ~ 10 miles from son. 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 Son lives nearby, also widowed and retired. Lots of support from church. Only other family is brother in [place]. 65-70 pack year smoking history, quit cold [place] 8 years ago. He does not have advance directives completed, but we have discussed his wishes now: he does not want resuscitation or life support. DNR noted in this EMR now. Son is in agreement with DNR designation.   FAMILY HISTORY:  No cancer in family Father  with heart disease       PHYSICAL EXAM:  height is 5\' 7"  (1.702 m) and weight is 121 lb 4.8 oz (55.021 kg). His oral temperature is 97.5 F (36.4 C). His blood pressure is 112/61 and his pulse is 107. His respiration is 17 and oxygen saturation is 95%.  Elderly, thin gentleman, ambulatory slowly, somewhat hard of hearing despite hearing aides.  Extremely pleasant, son very supportive.  HEENT: PERRL, not icteric. Oral mucosa moist, no lesions, dentures in. Hearing aides. Neck supple without JVD  RESPIRATORY: Respirations not labored RA. No use of accessory muscles. Clear to A and P  CARDIAC/ VASCULAR: RRR, tachy, no gallop. Peripheral pulses present  ABDOMEN: soft, probably somewhat distended, quiet, not tender to gentle palpation including epigastrium, cannot feel liver edge, no rub. Colostomy with nothing in bag now. Surgical scar not remarkable.  LYMPH NODES: no cervical or supraclavicular adenopathy  NEUROLOGIC/ psychiatric: other than hearing, no focal deficits CN, motor, sensory, cerebellar. Alert, oriented, appropriate conversation, affect not depressed.  SKIN: no rash, ecchymoses, petechiae  MUSCULOSKELETAL: muscle wasting all extremities. No CCE. Back not tender    LABORATORY DATA:  Available after visit:  WBC 16.2, ANC 14.2, Hgb 13.8, plt 426k, MCV 94, diff noted CMET with NA 134, K 5.2, glu 142, BUN 40 creat 1.7, T bili 1.0, AP 406, AST 50, ALT 104, Tprot 7.2, alb 3.4, ca 10.4 CEA 30 CA 19-9   4492  PATHOLOGY:  Collected: 04/02/2013  Accession: [id number] EPORT OF SURGICAL Diagnosis Liver, needle/core biopsy, Right Hepatic Lobe, lesion - POSITIVE FOR METASTATIC ADENOCARCINOMA. - SEE COMMENT. Microscopic Comment Immunohistochemical stains are performed. The tumor is positive for cytokeratin 7 and CDX-2 with focal cytokeratin 20 positivity. The tumor is negative for TTF-1. The staining coupled with the morphology is consistent with metastatic adenocarcinoma. The staining pattern favors a pancreatobiliary or a gastrointestinal primary source.  RADIOGRAPHY: CT ABDOMEN AND PELVIS WITH CONTRAST  TECHNIQUE:  Multidetector CT imaging of the abdomen and pelvis was performed  using the standard protocol following bolus administration of  intravenous contrast.  CONTRAST: OMNIPAQUE IOHEXOL 300 MG/ML SOLN   COMPARISON: 02/26/2009 CT.  FINDINGS:  Right lung base 5.7 mm nodule previously not imaged. Metastatic  disease not excluded given the below described findings.  Interval development of a 5 x 2.4 cm pancreatic tail mass with  obstruction of the splenic vein with prominent varices extending  anterior aspect of the abdomen.  Interval development of ascites and peritoneal infiltration  suggestive of peritoneal spread of tumor.  Interval development of liver lesions largest in the right lobe  measuring up to 2 cm consistent with metastatic disease.  Lower pole right renal mass measuring up to 3.3 cm appears  relatively similar to the prior exam and is suspicious for slow  growing malignancy.  Prior distal colon resection with colostomy formation left lower  quadrant. Difficult to assess for bowel inflammatory process given  the above described findings. No free intraperitoneal air.  The stomach and duodenum are dilated to level of the ligament of  Treitz where pancreatic tumor may cause a stricture.  No osseous destructive lesion.  Prominent coronary artery calcifications. Prominent irregular plaque  of the abdominal aorta with abdominal aortic aneurysm measuring 4 x  3.6 cm versus prior 3.9 x 3.4 cm. Atherosclerosis with narrowing of  the iliac arteries.  IMPRESSION:  Interval development of a 5 x 2.4 cm pancreatic tail mass with  obstruction of the splenic vein with prominent varices  extending  anterior aspect of the abdomen. Findings highly suspicious for  malignancy possibly primary pancreatic malignancy although  metastatic disease from colon cancer or renal cancer not entirely  excluded.  The stomach and duodenum are dilated to level of the ligament of  Treitz where pancreatic tumor may cause a stricture.  Interval development of ascites and peritoneal infiltration  suggestive of peritoneal spread of tumor.  Interval development of liver lesions largest in the right lobe   measuring up to 2 cm consistent with metastatic disease.  Right lung base 5.7 mm nodule previously not imaged. Metastatic  disease not excluded.  Lower pole right renal mass measuring up to 3.3 cm appears  relatively similar to the prior exam and is suspicious for slow  growing malignancy.  Prior distal colon resection with colostomy formation left lower  quadrant. Difficult to assess for bowel inflammatory process given  the above described findings. No free intraperitoneal air.  Prominent coronary artery calcifications. Prominent irregular plaque  of the abdominal aorta with abdominal aortic aneurysm measuring 4 x  3.6 cm versus prior 3.9 x 3.4 cm. Atherosclerosis with narrowing of  the iliac arteries.      DISCUSSION: we have discussed all of history and information from work up as above (except labs not available during visit). Patient and son understand that this appears to be metastatic pancreatic cancer locally advanced and metastatic to liver. They understand that this cannot be cured, that surgery to resect disease would not be helpful, and that chemotherapy in palliative attempt could cause other side effects or problems. Mr Reppond wants help with symptoms as possible, but is not interested in aggressive interventions. He will be given IVF with IV zofran later today. I have spoken directly with staff at Dr Venita Sheffield office, and Dr Danise Edge has kindly rearranged his clinic to allow him to see Mr Heslop this afternoon re likely gastric outlet obstruction. He will continue prn pain medication, tho this may need to change if not effective or if not keeping it down. Zofran ODT sent to pharmacy. They understand that they can be in contact with this office at any time if needed prior to visit back with me 04-22-13. If status improves from gastric outlet obstruction, may consider palliative gemzar/ abraxane, but PS really not adequate even for that now. I expect that Hospice will be  beneficial in near future, but did not talk with them specifically about this now.  I spoke with patient back at [facility] receiving IVF following his consultation with Dr Laural Benes. He understands that Dr Laural Benes will evaluate for possible stent depending on further imaging of the obstruction.   IMPRESSION / PLAN:   1.pancreatic adenocarcinoma metastatic to liver, obstructing splenic vein, apparent gastric outlet partial obstruction, likely peritoneal spread: in 78 yo gentleman who has lost 30 lbs in ~ 2 months. Symptom management is goal. Plan as above. Dr Henriette Combs help today appreciated. 2.DNR patient's request 3. colon cancer 21 years ago, treated surgically with colostomy, no other information presently available 3.Diabetes x 3 years: decrease in oral hypoglycemics last week, likely will need further decrease if no improvement in po's. I have recommended that son learn to use glucometer. 4.AAA stable and not symptomatic 5.chronic renal disease 6.long past tobacco and hx COPD   Patient and son have had questions answered to their satisfaction and are in agreement with plan above. They can contact this office for questions or concerns at any time prior to next scheduled visit.  Time spent  55 min , including >50% discussion and coordination of care.    Reece Packer, MD 04/15/2013 12:33 PM

## 2013-04-15 NOTE — Progress Notes (Signed)
Okay to discontinue blood sugar check prior to completing NS infusion per Dr. Darrold Span.  Per desk nurse, son instructed for Shane [name] to stop taking aspirin at home and zofran ODT has been called in to [facility].

## 2013-04-15 NOTE — Progress Notes (Signed)
79 year old male diagnosed with liver lesion/pancreas cancer.  Past medical history includes: Colon ca with colostomy, AAA, COPD, Chronic Kidney disease, DM, hard of hearing.  Medications include glipizide, oxycodone and MVI.  Labs include Glucose 117 on Sept 30.  Height:  67 inches Weight: 130 pounds Usual body weight:  158 pounds per patient.  153 pounds Sept 3. BMI:  20.36  Met with patient and son.  Patient reports he has vomited every day since August 2.  He reports he has not been able to eat since that day.  He reports vomiting with solid foods and liquids such as Boost.  He has tried small amounts of food throughout the day but reports he "keeps it for an hour and then throws up."  He has tried to eat dry cheerios or crackers before pain medication however, cannot keep anything down.  Patient denies other nutrition side effects.  Reports some output through colostomy.  He has stool softeners if needed.  Nutrition Diagnosis:  Inadequate oral intake related to nausea/vomiting as evidenced by 15% weight loss over 6 weeks.  Patient meets criteria for severe malnutrition in the context of acute illness secondary to greater than 5% weight loss in one month and less than 50% estimated energy requirements for greater than 5 days.  Patient also noted to have severe depletion of both muscle and fat stores on physical exam.  Intervention:  Educated patient and son on trying small amounts of bland foods and liquids every 2 hours.  Expect patient will need medication for nausea control. (patient sees MD today) Provided fact sheets and samples of nutrition supplements for patient to try once nausea controlled.  Questions answered and teach back method used. Contact information provided.  Monitoring, evaluation, goals:  Patient will tolerate increased oral intake with improvement in nausea for increased quality of life.  Next Visit:  I am available to follow patient as needed.

## 2013-04-15 NOTE — Telephone Encounter (Signed)
appts made and printed. Shane [name] already made his appt for GI with Dr. Laural Benes...td

## 2013-04-15 NOTE — Patient Instructions (Signed)
Dehydration, Elderly  Dehydration is when you lose more fluids from the body than you take in. Vital organs such as the kidneys, brain, and heart cannot function without a proper amount of fluids and salt. Any loss of fluids from the body can cause dehydration.   Older adults are at a higher risk of dehydration than younger adults. As we age, our bodies are less able to conserve water and do not respond to temperature changes as well. Also, older adults do not become thirsty as easily or quickly. Because of this, older adults often do not realize they need to increase fluids to avoid dehydration.   CAUSES    Vomiting.   Diarrhea.   Excessive sweating.   Excessive urine output.   Fever.  SYMPTOMS   Mild dehydration   Thirst.   Dry lips.   Slightly dry mouth.  Moderate dehydration   Very dry mouth.   Sunken eyes.   Skin does not bounce back quickly when lightly pinched and released.   Dark urine and decreased urine production.   Decreased tear production.   Headache.  Severe dehydration   Very dry mouth.   Extreme thirst.   Rapid, weak pulse (more than 100 beats per minute at rest).   Cold hands and feet.   Not able to sweat in spite of heat.   Rapid breathing.   Blue lips.   Confusion and lethargy.   Difficulty being awakened.   Minimal urine production.   No tears.  DIAGNOSIS   Your caregiver will diagnose dehydration based on your symptoms and your exam. Blood and urine tests will help confirm the diagnosis. The diagnostic evaluation should also identify the cause of dehydration.  TREATMENT   Treatment of mild or moderate dehydration can often be done at home by increasing the amount of fluids that you drink. It is best to drink small amounts of fluid more often. Drinking too much at one time can make vomiting worse.   Severe dehydration needs to be treated at the hospital where you will probably be given intravenous (IV) fluids that contain water and electrolytes.  HOME CARE INSTRUCTIONS     Ask your caregiver about specific rehydration instructions.   Drink enough fluids to keep your urine clear or pale yellow.   Drink small amounts frequently if you have nausea and vomiting.   Eat as you normally do.   Avoid:   Foods or drinks high in sugar.   Carbonated drinks.   Juice.   Extremely hot or cold fluids.   Drinks with caffeine.   Fatty, greasy foods.   Alcohol.   Tobacco.   Overeating.   Gelatin desserts.   Wash your hands well to avoid spreading bacteria and viruses.   Only take over-the-counter or prescription medicines for pain, discomfort, or fever as directed by your caregiver.   Ask your caregiver if you should continue all prescribed and over-the-counter medicines.   Keep all follow-up appointments with your caregiver.  SEEK MEDICAL CARE IF:   You have abdominal pain and it increases or stays in one area (localizes).   You have a rash, stiff neck, or severe headache.   You are irritable, sleepy, or difficult to awaken.   You are weak, dizzy, or extremely thirsty.  SEEK IMMEDIATE MEDICAL CARE IF:    You are unable to keep fluids down, or you get worse despite treatment.   You have frequent episodes of vomiting or diarrhea.   You have blood or green   matter (bile) in your vomit.   You have blood in your stool or your stool looks black and tarry.   You have not urinated in 6 to 8 hours, or you have only urinated a small amount of very dark urine.   You have a fever.   You faint.  MAKE SURE YOU:    Understand these instructions.   Will watch your condition.   Will get help right away if you are not doing well or get worse.  Document Released: 09/10/2003 Document Revised: 09/12/2011 Document Reviewed: 02/07/2011  Exit[employer] Patient Information 2014 [employer].

## 2013-04-16 ENCOUNTER — Other Ambulatory Visit: Payer: Self-pay | Admitting: Gastroenterology

## 2013-04-16 ENCOUNTER — Ambulatory Visit
Admission: RE | Admit: 2013-04-16 | Discharge: 2013-04-16 | Disposition: A | Discharge status: Home / Self Care | Payer: Medicare Other | Source: Ambulatory Visit | Attending: Gastroenterology | Admitting: Gastroenterology

## 2013-04-16 DIAGNOSIS — K311 Adult hypertrophic pyloric stenosis: Secondary | ICD-10-CM

## 2013-04-16 LAB — CANCER ANTIGEN 19-9: CA 19-9: 4491.8 U/mL — ABNORMAL HIGH (ref <35.0)

## 2013-04-18 ENCOUNTER — Ambulatory Visit: Payer: Medicare Other | Admitting: Oncology

## 2013-04-18 ENCOUNTER — Ambulatory Visit: Payer: Medicare Other

## 2013-04-20 ENCOUNTER — Encounter: Payer: Self-pay | Admitting: Oncology

## 2013-04-22 ENCOUNTER — Encounter: Payer: Self-pay | Admitting: Oncology

## 2013-04-22 ENCOUNTER — Encounter (HOSPITAL_COMMUNITY): Payer: Self-pay

## 2013-04-22 ENCOUNTER — Other Ambulatory Visit (HOSPITAL_BASED_OUTPATIENT_CLINIC_OR_DEPARTMENT_OTHER): Payer: Medicare Other | Admitting: Lab

## 2013-04-22 ENCOUNTER — Ambulatory Visit (HOSPITAL_BASED_OUTPATIENT_CLINIC_OR_DEPARTMENT_OTHER): Payer: Self-pay | Admitting: Oncology

## 2013-04-22 ENCOUNTER — Inpatient Hospital Stay (HOSPITAL_COMMUNITY)
Admission: AD | Admit: 2013-04-22 | Discharge: 2013-04-27 | DRG: 435 | Disposition: A | Discharge status: Hospice | Payer: Medicare Other | Source: Ambulatory Visit | Attending: Oncology | Admitting: Oncology

## 2013-04-22 VITALS — BP 120/69 | HR 99 | Temp 98.4°F | Resp 20 | Ht 67.0 in | Wt 118.2 lb

## 2013-04-22 DIAGNOSIS — G4700 Insomnia, unspecified: Secondary | ICD-10-CM | POA: Diagnosis present

## 2013-04-22 DIAGNOSIS — K3184 Gastroparesis: Secondary | ICD-10-CM | POA: Diagnosis present

## 2013-04-22 DIAGNOSIS — H9190 Unspecified hearing loss, unspecified ear: Secondary | ICD-10-CM | POA: Diagnosis present

## 2013-04-22 DIAGNOSIS — I714 Abdominal aortic aneurysm, without rupture, unspecified: Secondary | ICD-10-CM | POA: Diagnosis present

## 2013-04-22 DIAGNOSIS — K224 Dyskinesia of esophagus: Secondary | ICD-10-CM | POA: Diagnosis present

## 2013-04-22 DIAGNOSIS — J449 Chronic obstructive pulmonary disease, unspecified: Secondary | ICD-10-CM | POA: Diagnosis present

## 2013-04-22 DIAGNOSIS — Z66 Do not resuscitate: Secondary | ICD-10-CM | POA: Diagnosis present

## 2013-04-22 DIAGNOSIS — Z79899 Other long term (current) drug therapy: Secondary | ICD-10-CM

## 2013-04-22 DIAGNOSIS — E119 Type 2 diabetes mellitus without complications: Secondary | ICD-10-CM | POA: Diagnosis present

## 2013-04-22 DIAGNOSIS — E46 Unspecified protein-calorie malnutrition: Secondary | ICD-10-CM | POA: Insufficient documentation

## 2013-04-22 DIAGNOSIS — Z681 Body mass index (BMI) 19 or less, adult: Secondary | ICD-10-CM

## 2013-04-22 DIAGNOSIS — E43 Unspecified severe protein-calorie malnutrition: Secondary | ICD-10-CM | POA: Diagnosis present

## 2013-04-22 DIAGNOSIS — C787 Secondary malignant neoplasm of liver and intrahepatic bile duct: Secondary | ICD-10-CM

## 2013-04-22 DIAGNOSIS — Z23 Encounter for immunization: Secondary | ICD-10-CM

## 2013-04-22 DIAGNOSIS — M6250 Muscle wasting and atrophy, not elsewhere classified, unspecified site: Secondary | ICD-10-CM | POA: Diagnosis present

## 2013-04-22 DIAGNOSIS — C259 Malignant neoplasm of pancreas, unspecified: Principal | ICD-10-CM | POA: Diagnosis present

## 2013-04-22 DIAGNOSIS — K311 Adult hypertrophic pyloric stenosis: Secondary | ICD-10-CM | POA: Diagnosis present

## 2013-04-22 DIAGNOSIS — Z515 Encounter for palliative care: Secondary | ICD-10-CM

## 2013-04-22 DIAGNOSIS — R110 Nausea: Secondary | ICD-10-CM

## 2013-04-22 DIAGNOSIS — E860 Dehydration: Secondary | ICD-10-CM | POA: Diagnosis present

## 2013-04-22 DIAGNOSIS — K315 Obstruction of duodenum: Secondary | ICD-10-CM | POA: Diagnosis present

## 2013-04-22 DIAGNOSIS — R634 Abnormal weight loss: Secondary | ICD-10-CM | POA: Diagnosis present

## 2013-04-22 DIAGNOSIS — R531 Weakness: Secondary | ICD-10-CM

## 2013-04-22 DIAGNOSIS — F411 Generalized anxiety disorder: Secondary | ICD-10-CM | POA: Diagnosis present

## 2013-04-22 DIAGNOSIS — C189 Malignant neoplasm of colon, unspecified: Secondary | ICD-10-CM

## 2013-04-22 DIAGNOSIS — M19049 Primary osteoarthritis, unspecified hand: Secondary | ICD-10-CM | POA: Diagnosis present

## 2013-04-22 DIAGNOSIS — K117 Disturbances of salivary secretion: Secondary | ICD-10-CM

## 2013-04-22 DIAGNOSIS — J4489 Other specified chronic obstructive pulmonary disease: Secondary | ICD-10-CM | POA: Diagnosis present

## 2013-04-22 DIAGNOSIS — Z87891 Personal history of nicotine dependence: Secondary | ICD-10-CM

## 2013-04-22 DIAGNOSIS — N189 Chronic kidney disease, unspecified: Secondary | ICD-10-CM | POA: Diagnosis present

## 2013-04-22 DIAGNOSIS — Z85038 Personal history of other malignant neoplasm of large intestine: Secondary | ICD-10-CM

## 2013-04-22 DIAGNOSIS — Z933 Colostomy status: Secondary | ICD-10-CM

## 2013-04-22 LAB — BASIC METABOLIC PANEL (CC13)
BUN: 41.5 mg/dL — ABNORMAL HIGH (ref 7.0–26.0)
Calcium: 9.9 mg/dL (ref 8.4–10.4)
Chloride: 97 mEq/L — ABNORMAL LOW (ref 98–109)
Creatinine: 2.2 mg/dL — ABNORMAL HIGH (ref 0.7–1.3)
Glucose: 125 mg/dl (ref 70–140)

## 2013-04-22 LAB — CBC WITH DIFFERENTIAL/PLATELET
Basophils Absolute: 0 10*3/uL (ref 0.0–0.1)
EOS%: 0.4 % (ref 0.0–7.0)
HCT: 40.1 % (ref 38.4–49.9)
HGB: 13.5 g/dL (ref 13.0–17.1)
LYMPH%: 6.5 % — ABNORMAL LOW (ref 14.0–49.0)
MCH: 31.8 pg (ref 27.2–33.4)
MCV: 94.7 fL (ref 79.3–98.0)
MONO%: 8.5 % (ref 0.0–14.0)
NEUT%: 84.4 % — ABNORMAL HIGH (ref 39.0–75.0)
lymph#: 0.9 10*3/uL (ref 0.9–3.3)

## 2013-04-22 LAB — GLUCOSE, CAPILLARY: Glucose-Capillary: 102 mg/dL — ABNORMAL HIGH (ref 70–99)

## 2013-04-22 MED ORDER — OXYCODONE HCL 5 MG PO CAPS
5.0000 mg | ORAL_CAPSULE | Freq: Three times a day (TID) | ORAL | Status: DC | PRN
  Filled 2013-04-22: qty 1

## 2013-04-22 MED ORDER — BIOTENE DRY MOUTH MT LIQD: 15.0000 mL | OROMUCOSAL | Status: DC | PRN

## 2013-04-22 MED ORDER — SODIUM CHLORIDE 0.9 % IV SOLN
INTRAVENOUS | Status: DC
  Administered 2013-04-22 – 2013-04-25 (×4): via INTRAVENOUS

## 2013-04-22 MED ORDER — ONDANSETRON 8 MG PO TBDP
8.0000 mg | ORAL_TABLET | Freq: Two times a day (BID) | ORAL | Status: DC | PRN
  Filled 2013-04-22: qty 1

## 2013-04-22 MED ORDER — ONDANSETRON HCL 4 MG/2ML IJ SOLN: 4.0000 mg | Freq: Four times a day (QID) | INTRAMUSCULAR | Status: DC | PRN

## 2013-04-22 MED ORDER — ACETAMINOPHEN 650 MG RE SUPP: 650.0000 mg | Freq: Four times a day (QID) | RECTAL | Status: DC | PRN

## 2013-04-22 MED ORDER — ONDANSETRON HCL 8 MG PO TABS: 8.0000 mg | ORAL_TABLET | Freq: Four times a day (QID) | ORAL | Status: DC | PRN

## 2013-04-22 MED ORDER — BIOTENE MOISTURIZING MOUTH MT SOLN
1.0000 | OROMUCOSAL | Status: DC | PRN
  Administered 2013-04-22: 1 via OROMUCOSAL
  Filled 2013-04-22: qty 1

## 2013-04-22 MED ORDER — METOCLOPRAMIDE HCL 5 MG/5ML PO SOLN
5.0000 mg | Freq: Three times a day (TID) | ORAL | Status: DC
  Administered 2013-04-22 – 2013-04-23 (×2): 5 mg via ORAL
  Filled 2013-04-22 (×5): qty 5

## 2013-04-22 MED ORDER — GLIPIZIDE 5 MG PO TABS
5.0000 mg | ORAL_TABLET | Freq: Every day | ORAL | Status: DC
  Filled 2013-04-22 (×2): qty 1

## 2013-04-22 MED ORDER — ACETAMINOPHEN 325 MG PO TABS
650.0000 mg | ORAL_TABLET | Freq: Four times a day (QID) | ORAL | Status: DC | PRN
  Administered 2013-04-22: 650 mg via ORAL
  Filled 2013-04-22: qty 2

## 2013-04-22 MED ORDER — CEFAZOLIN SODIUM-DEXTROSE 2-3 GM-% IV SOLR
2.0000 g | INTRAVENOUS | Status: AC
  Administered 2013-04-23: 2 g via INTRAVENOUS
  Filled 2013-04-22 (×2): qty 50

## 2013-04-22 MED ORDER — INFLUENZA VAC SPLIT QUAD 0.5 ML IM SUSP
0.5000 mL | INTRAMUSCULAR | Status: AC
  Administered 2013-04-23: 0.5 mL via INTRAMUSCULAR
  Filled 2013-04-22 (×2): qty 0.5

## 2013-04-22 NOTE — Patient Instructions (Signed)
Admission to [facility] now

## 2013-04-22 NOTE — Progress Notes (Signed)
Palliative Medicine Team consult received, patient seen at bedside sound asleep, spoke with patient's son Timoty Bourke [phone], Clide Cliff stated he is only available between 8 and 11 am tomorrow to meet - Ricky asked that the PMT provider speak directly with his dad and call him, during or after, this conversation, as PMT provider availability tomorrow is at 1:30 pm -will attempt to speak at that time with patient provided he is not off floor for procedure (noted per IR note)   Copy he is concerned about patient's care and does not feel he is able to meet his care needs at home, he shared his dad is a Primary school teacher and feels he should have dignity and comfort during his last days  -will follow up in am to see if it is known when patient will be going to IR  - Clide Cliff stated he is available to meet on Wednesday morning 10/22 @ 8:30 am- should this be needed PMT will follow up in am   Valente David, RN 04/22/2013, 7:11 PM Palliative Medicine Team RN Liaison [phone]

## 2013-04-22 NOTE — Progress Notes (Signed)
Medical Oncology  Admitted to [facility] from [facility] clinic this day. See H&P for details. Charge will be admission, not outpatient visit.  Ila Mcgill, MD

## 2013-04-22 NOTE — H&P (Signed)
HISTORY AND PHYSICAL EXAM  04-22-2013  Other physicians: K.Husain, M.Johnson, C.Edilia Bo  HPI: Shane [name] is a 79 yo man with recently diagnosed metastatic pancreatic cancer to liver, admitted from [facility] with uncontrolled symptoms from gastric outlet/ duodenal obstruction related to the cancer and/or "nutcracker" compression between AAA and SMA. He is unable to manage adequate oral hydration, as evidenced by weight loss of 30 lbs since early August 2014 and additional weight loss of another 3 lbs since 04-15-13 despite IVF at [facility] also on 04-15-13. He has been evaluated by Dr Danise Edge for the gastric outlet type obstruction, which is not amenable to stenting as it is external to GI tract; he may have some element of gastroparesis in addition. He continues to vomit generally multiple times daily, especially if he takes any po's. He denies pain.   Patient seemed in his usual fairly good health, including driving cars to auto auction, until early August 2014 when he developed pain RLQ abdomen and vomiting. Symptoms progressed and he had CT AP with contrast 03-26-13, which showed 5.2.4 cm pancreatic tail mass with obstruction of splenic vein, ascites and peritoneal infiltration, liver lesions up to 2 cm and stomach/duodenum dilated to ligament of Treitz, all new compared with 2010; also by that CT stable 3.3 cm right renal mass, prior distal colon resection and AAA 4x3.6 cm. He had US biopsy of right liver lesion 04-02-13, with path [id number] positive for adenocarcinoma with staining favoring pancreaticobiliary or GI primary. He was seen as new patient by Dr Darrold Span at [facility] on 04-15-13, with IVF given that day and urgent evaluation by Dr Danise Edge also that day. He had UGI with KUB by Dr Laural Benes 04-16-13, which shows all obstructive compression is external, described as nutcracker phenomenon of compression of duodenum between aortic aneurysm and SMA, without gastric mass or  duodenal lesion. I have discussed this information now with Dr Laural Benes, who also had reviewed with Dr Vida Rigger, and they feel stent would not stay in position. The imaging also suggests some gastroparesis/ esophageal dysmotility such that we could do careful trial of reglan. We may want to consider PEG by IR for palliation of vomiting.     ROS nausea slightly better with ODT zofran, still vomiting yesterday, none today but has taken nothing po. Feels weak and mouth dry. Small BM x1 since I saw him on 10-13 (from colostomy). No pain, no increased SOB. No fever, no bleeding. Not voiding much. Checks blood sugars once daily. Usual weight for years 150 lbs, until this weight loss. No HA. Wears reading glasses. Decreased hearing since WWII injury. Dentures. No thyroid disease known. No LE swelling. Can only lie on left side.   Allergies: No Known Allergies Past Medical History  Diagnosis Date  . AAA (abdominal aortic aneurysm)   . COPD (chronic obstructive pulmonary disease)   . Cancer     Colon cancer  . Chronic kidney disease   . Arthritis   Colon cancer 1993 treated with surgical resection with colostomy. Patient does not recall surgeon's name and that information is not available in present EMR.  COPD with long past tobacco, DCd 2006.  Diabetes x 3 years, oral medications decreased last week due to poor po intake.  AAA  Arthritis hands  CKD     Family History  Problem Relation Age of Onset  . Heart disease Father   No cancer in family  Social History:  reports that he quit smoking about 8 years ago.  He has never used smokeless tobacco. He reports that he does not drink alcohol or use illicit drugs. From [employer]. Widowed, lives alone ~ 10 miles from son. 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 Son lives nearby, also widowed and retired. Lots of support from church. Only other family is brother in  [place]. 65-70 pack year smoking history, quit cold [place] 8 years ago.  He does not have advance directives completed, but he does not want resuscitation or life support. DNR noted. Son is in agreement with DNR designation. Son has been assisting with care during days (tho patient stays alone at night), but does not feel he can manage at home any longer. Son and patient are both willing to have hospice assistance either inpatient hospice facility or possibly at home. They do not have any home care agencies involved prior to admission today.    Physical Exam  Cachectic, frail-appearing, elderly gentleman, ambulatory slowly, pale, alert, oriented, appropriate responses, very pleasant. Looks uncomfortable but not in acute distress.  Temp 99, weight 118 lbs, down from 121 on 04-15-13. Resp 20 not labored at rest on RA. BP 120/69  HEENT: PERRL, not icteric. Oral mucosa dry, dentures in. Hearing aides. Neck supple without JVD. Lungs without wheezes or rales, hyperresonant to percussion and diminished BS bilaterally.  Heart RRR, no gallop or murmur Abdomen quiet, soft, not tender including epigastrium, not more distended than at my exam last week. Colostomy bag in place. Surgical scars not remarkable Extremities without pitting edema, cords, tenderness. Significant muscle wasting Back nontender Lymphatics: no adenopathy cervical, supraclavicular Skin without rash, ecchymosis, petechiae Neuro/ psych: as above. No focal deficits other than hearing  Medications Prior to Admission  Medication Sig Dispense Refill  . glipiZIDE (GLUCOTROL) 5 MG tablet Take 5 mg by mouth daily.       . Multiple Vitamin (MULTIVITAMIN WITH MINERALS) TABS Take 1 tablet by mouth daily.      . ondansetron (ZOFRAN-ODT) 8 MG disintegrating tablet Take 1 tablet (8 mg total) by mouth every 12 (twelve) hours as needed for nausea.  20 tablet  0  . oxycodone (OXY-IR) 5 MG capsule Take 5 mg by mouth every 8 (eight) hours as needed.         Results for orders placed in visit on 04/22/13 (from the past 48 hour(s))  CBC WITH DIFFERENTIAL     Status: Abnormal   Collection Time    04/22/13  8:09 AM      Result Value Range   WBC 14.2 (*) 4.0 - 10.3 10e3/uL   NEUT# 12.0 (*) 1.5 - 6.5 10e3/uL   HGB 13.5  13.0 - 17.1 g/dL   HCT 16.1  09.6 - 04.5 %   Platelets 381  140 - 400 10e3/uL   MCV 94.7  79.3 - 98.0 fL   MCH 31.8  27.2 - 33.4 pg   MCHC 33.6  32.0 - 36.0 g/dL   RBC 4.09  8.11 - 9.14 10e6/uL   RDW 13.8  11.0 - 14.6 %   lymph# 0.9  0.9 - 3.3 10e3/uL   MONO# 1.2 (*) 0.1 - 0.9 10e3/uL   Eosinophils Absolute 0.1  0.0 - 0.5 10e3/uL   Basophils Absolute 0.0  0.0 - 0.1 10e3/uL   NEUT% 84.4 (*) 39.0 - 75.0 %   LYMPH% 6.5 (*) 14.0 - 49.0 %   MONO% 8.5  0.0 - 14.0 %   EOS%  0.4  0.0 - 7.0 %   BASO% 0.2  0.0 - 2.0 %  BASIC METABOLIC PANEL (CC13)     Status: Abnormal   Collection Time    04/22/13  8:09 AM      Result Value Range   Sodium 132 (*) 136 - 145 mEq/L   Potassium 5.2 (*) 3.5 - 5.1 mEq/L   Chloride 97 (*) 98 - 109 mEq/L   CO2 21 (*) 22 - 29 mEq/L   Glucose 125  70 - 140 mg/dl   BUN 40.9 (*) 7.0 - 81.1 mg/dL   Creatinine 2.2 (*) 0.7 - 1.3 mg/dL   Calcium 9.9  8.4 - 91.4 mg/dL   Anion Gap 14 (*) 3 - 11 mEq/L       IMAGING  UPPER GI SERIES W/ KUB  04-16-13 TECHNIQUE:  After obtaining a scout radiograph a routine upper GI series was  performed using thin and high density barium.  COMPARISON: CT scan 03/26/2013.  FLUOROSCOPY TIME: 4 min and 18 seconds  FINDINGS:  Initial barium swallows demonstrate esophageal dysmotility with  occasional disruption of the primary peristaltic wave an occasional  tertiary contractions. No intrinsic or extrinsic lesions are  identified. No hiatal hernia or gastroesophageal reflux.  The stomach demonstrates slow emptying and no discernable gastric  contractions possibly suggesting gastroparesis. There is also mild  dilatation of the duodenum to the level of the duodenum  jejunal  junction with focal narrowing of the duodenum. Reviewing the prior  CT scan this is likely due to a nutcracker phenomenon with  compression of the duodenum between the aortic aneurysm and SMA. No  stomach mass or duodenum lesion is identified.  IMPRESSION:  1. Nonspecific esophageal dysmotility. No intrinsic or extrinsic  esophageal lesions.  2. Poorly emptying stomach suggesting gastroparesis.  3. Mild dilatation of the proximal duodenum and narrowing of the 3rd  portion. Correlating with the recent CT scan I think this is likely  due to a nutcracker phenomenon with compression of the 3rd portion  of the duodenum between the aortic aneurysm and SMA    CT ABDOMEN AND PELVIS WITH CONTRAST 03-26-13 TECHNIQUE:  Multidetector CT imaging of the abdomen and pelvis was performed  using the standard protocol following bolus administration of  intravenous contrast.  CONTRAST: OMNIPAQUE IOHEXOL 300 MG/ML SOLN  COMPARISON: 02/26/2009 CT.  FINDINGS:  Right lung base 5.7 mm nodule previously not imaged. Metastatic  disease not excluded given the below described findings.  Interval development of a 5 x 2.4 cm pancreatic tail mass with  obstruction of the splenic vein with prominent varices extending  anterior aspect of the abdomen.  Interval development of ascites and peritoneal infiltration  suggestive of peritoneal spread of tumor.  Interval development of liver lesions largest in the right lobe  measuring up to 2 cm consistent with metastatic disease.  Lower pole right renal mass measuring up to 3.3 cm appears  relatively similar to the prior exam and is suspicious for slow  growing malignancy.  Prior distal colon resection with colostomy formation left lower  quadrant. Difficult to assess for bowel inflammatory process given  the above described findings. No free intraperitoneal air.  The stomach and duodenum are dilated to level of the ligament of  Treitz where pancreatic  tumor may cause a stricture.  No osseous destructive lesion.  Prominent coronary artery calcifications. Prominent irregular plaque  of the abdominal aorta with abdominal aortic aneurysm measuring 4 x  3.6 cm versus prior 3.9  x 3.4 cm. Atherosclerosis with narrowing of  the iliac arteries.  IMPRESSION:  Interval development of a 5 x 2.4 cm pancreatic tail mass with  obstruction of the splenic vein with prominent varices extending  anterior aspect of the abdomen. Findings highly suspicious for  malignancy possibly primary pancreatic malignancy although  metastatic disease from colon cancer or renal cancer not entirely  excluded.  The stomach and duodenum are dilated to level of the ligament of  Treitz where pancreatic tumor may cause a stricture.  Interval development of ascites and peritoneal infiltration  suggestive of peritoneal spread of tumor.  Interval development of liver lesions largest in the right lobe  measuring up to 2 cm consistent with metastatic disease.  Right lung base 5.7 mm nodule previously not imaged. Metastatic  disease not excluded.  Lower pole right renal mass measuring up to 3.3 cm appears  relatively similar to the prior exam and is suspicious for slow  growing malignancy.  Prior distal colon resection with colostomy formation left lower  quadrant. Difficult to assess for bowel inflammatory process given  the above described findings. No free intraperitoneal air.  Prominent coronary artery calcifications. Prominent irregular plaque  of the abdominal aorta with abdominal aortic aneurysm measuring 4 x  3.6 cm versus prior 3.9 x 3.4 cm. Atherosclerosis with narrowing of  the iliac arteries.     Assessment/Plan 1.Pancreatic cancer locally advanced and metastatic to liver: treatment all in palliative attempt. PS not adequate to attempt palliative chemotherapy 2.DNR patient's request 3.Gastric outlet/ duodenal obstruction from external compression between  aortic aneurysm and SMA. Internal stent would not stay in position as all of problem is external to bowel. Consider G tube for palliative management 4.AAA known to vascular surgery: essentially stable by recent scans 5.diabetes x3 years, on oral agent: with poor po intake I will hold glipizide and we will follow blood sugars, NOT appropriate for tight control now 6.nonoliguric renal failure: dehydration and chronic kidney disease. Will continue gentle IV hydration for now 7.colon cancer 1993 treated surgically with colostomy, not known recurrent 8.COPD with long past tobacco 9. Severe malnutrition with unintentional weight loss of at least 33 lbs since early August   Addendum: patient seen again 1530 and this MD spoke with son by phone again then. Discussed with IR physician assistant, hospital pharmacist and RN on floor. May need NG to decompress stomach prior to G tube by IR tomorrow. Hospice consult requested.   LIVESAY,Shane [name] 04/22/2013, 9:53 AM

## 2013-04-22 NOTE — Consult Note (Signed)
HPI: Shane [name] is an 79 y.o. male with history of metastatic adenocarcinoma likely from the pancreas. He has associated gastric outlet obstruction from disease progression and/or compression of duodenum from AAA/SMA and gastroparesis. He is at the point where he has significant post prandial N/V. He has been admitted and IR is asked to place a decompressive palliative gastrostomy tube. PMHx and meds reviewed. Pt otherwise feels ok, no recent infectious issues.  Past Medical History:  Past Medical History  Diagnosis Date  . AAA (abdominal aortic aneurysm)   . COPD (chronic obstructive pulmonary disease)   . Cancer     Colon cancer  . Chronic kidney disease   . Arthritis     Past Surgical History:  Past Surgical History  Procedure Laterality Date  . Colon surgery with colostomy  1983  . Cataract extraction, bilateral      Family History:  Family History  Problem Relation Age of Onset  . Heart disease Father     Social History:  reports that he quit smoking about 8 years ago. He has never used smokeless tobacco. He reports that he does not drink alcohol or use illicit drugs.  Allergies: No Known Allergies  Medications:   Medication List    ASK your doctor about these medications       glipiZIDE 5 MG tablet  Commonly known as:  GLUCOTROL  Take 5 mg by mouth daily.     multivitamin with minerals Tabs tablet  Take 1 tablet by mouth daily.     ondansetron 8 MG disintegrating tablet  Commonly known as:  ZOFRAN-ODT  Take 1 tablet (8 mg total) by mouth every 12 (twelve) hours as needed for nausea.     oxyCODONE 5 MG immediate release tablet  Commonly known as:  Oxy IR/ROXICODONE  Take 2.5-5 mg by mouth every 4 (four) hours as needed for pain.        Please HPI for pertinent positives, otherwise complete 10 system ROS negative.  Physical Exam: BP 101/45  Pulse 75  Temp(Src) 98.3 F (36.8 C) (Oral)  Resp 16  Ht 5\' 7"  (1.702 m)  Wt 118 lb (53.524 kg)  BMI  18.48 kg/m2  SpO2 96% Body mass index is 18.48 kg/(m^2).   General Appearance:  Alert, cooperative, no distress, appears stated age  Head:  Normocephalic, without obvious abnormality, atraumatic  ENT: Unremarkable  Neck: Supple, symmetrical, trachea midline  Lungs:   Clear to auscultation bilaterally, no w/r/r.  Chest Wall:  No tenderness or deformity  Heart:  Regular rate and rhythm, S1, S2 normal, no murmur, rub or gallop.  Abdomen:   Soft, non-tender, non distended. LLQ ostomy with opaque pouch intact  Neurologic: Normal affect, no gross deficits.   Results for orders placed in visit on 04/22/13 (from the past 48 hour(s))  CBC WITH DIFFERENTIAL     Status: Abnormal   Collection Time    04/22/13  8:09 AM      Result Value Range   WBC 14.2 (*) 4.0 - 10.3 10e3/uL   NEUT# 12.0 (*) 1.5 - 6.5 10e3/uL   HGB 13.5  13.0 - 17.1 g/dL   HCT 16.1  09.6 - 04.5 %   Platelets 381  140 - 400 10e3/uL   MCV 94.7  79.3 - 98.0 fL   MCH 31.8  27.2 - 33.4 pg   MCHC 33.6  32.0 - 36.0 g/dL   RBC 4.09  8.11 - 9.14 10e6/uL   RDW 13.8  11.0 -  14.6 %   lymph# 0.9  0.9 - 3.3 10e3/uL   MONO# 1.2 (*) 0.1 - 0.9 10e3/uL   Eosinophils Absolute 0.1  0.0 - 0.5 10e3/uL   Basophils Absolute 0.0  0.0 - 0.1 10e3/uL   NEUT% 84.4 (*) 39.0 - 75.0 %   LYMPH% 6.5 (*) 14.0 - 49.0 %   MONO% 8.5  0.0 - 14.0 %   EOS% 0.4  0.0 - 7.0 %   BASO% 0.2  0.0 - 2.0 %  BASIC METABOLIC PANEL (CC13)     Status: Abnormal   Collection Time    04/22/13  8:09 AM      Result Value Range   Sodium 132 (*) 136 - 145 mEq/L   Potassium 5.2 (*) 3.5 - 5.1 mEq/L   Chloride 97 (*) 98 - 109 mEq/L   CO2 21 (*) 22 - 29 mEq/L   Glucose 125  70 - 140 mg/dl   BUN 16.1 (*) 7.0 - 09.6 mg/dL   Creatinine 2.2 (*) 0.7 - 1.3 mg/dL   Calcium 9.9  8.4 - 04.5 mg/dL   Anion Gap 14 (*) 3 - 11 mEq/L   No results found.  Assessment/Plan Metastatic adenocarcinoma. Gastroparesis and at least partial high grade gastric outlet obstruction. Pt has been  made DNR and is to have palliative meeting. Discussed role for decompressive G-tube. No family present but pt has a good understanding of what goal of this procedure is. Explained procedure, risks, complications, use of sedation. Though he is not distended, will have NGT placed to fully decompress stomach of any residual gastric contents to reduce procedural risks. WBC slightly elevated, uncertain etiology. No obvious infectious sources, pt is afebrile. Consent signed in chart   Brayton El PA-C 04/22/2013, 4:30 PM

## 2013-04-23 ENCOUNTER — Inpatient Hospital Stay (HOSPITAL_COMMUNITY): Payer: Medicare Other

## 2013-04-23 DIAGNOSIS — Z515 Encounter for palliative care: Secondary | ICD-10-CM

## 2013-04-23 DIAGNOSIS — R110 Nausea: Secondary | ICD-10-CM

## 2013-04-23 DIAGNOSIS — E860 Dehydration: Secondary | ICD-10-CM

## 2013-04-23 DIAGNOSIS — E43 Unspecified severe protein-calorie malnutrition: Secondary | ICD-10-CM | POA: Insufficient documentation

## 2013-04-23 DIAGNOSIS — R5381 Other malaise: Secondary | ICD-10-CM

## 2013-04-23 DIAGNOSIS — N19 Unspecified kidney failure: Secondary | ICD-10-CM

## 2013-04-23 DIAGNOSIS — R531 Weakness: Secondary | ICD-10-CM

## 2013-04-23 LAB — BASIC METABOLIC PANEL
CO2: 23 mEq/L (ref 19–32)
Calcium: 9.2 mg/dL (ref 8.4–10.5)
Chloride: 97 mEq/L (ref 96–112)
GFR calc Af Amer: 41 mL/min — ABNORMAL LOW (ref >90)
Glucose, Bld: 87 mg/dL (ref 70–99)
Potassium: 4.9 mEq/L (ref 3.5–5.1)
Sodium: 132 mEq/L — ABNORMAL LOW (ref 135–145)

## 2013-04-23 LAB — PROTIME-INR
INR: 1.1 (ref 0.00–1.49)
Prothrombin Time: 14 seconds (ref 11.6–15.2)

## 2013-04-23 LAB — GLUCOSE, CAPILLARY
Glucose-Capillary: 81 mg/dL (ref 70–99)
Glucose-Capillary: 92 mg/dL (ref 70–99)
Glucose-Capillary: 89 mg/dL (ref 70–99)

## 2013-04-23 LAB — APTT: aPTT: 38 seconds — ABNORMAL HIGH (ref 24–37)

## 2013-04-23 MED ORDER — LORAZEPAM 2 MG/ML IJ SOLN: 1.0000 mg | Freq: Four times a day (QID) | INTRAMUSCULAR | Status: DC | PRN

## 2013-04-23 MED ORDER — LIDOCAINE HCL 1 % IJ SOLN
INTRAMUSCULAR | Status: AC
  Filled 2013-04-23: qty 20

## 2013-04-23 MED ORDER — MORPHINE SULFATE 2 MG/ML IJ SOLN
1.0000 mg | INTRAMUSCULAR | Status: DC | PRN
  Administered 2013-04-23 – 2013-04-27 (×8): 1 mg via INTRAVENOUS
  Filled 2013-04-23 (×9): qty 1

## 2013-04-23 MED ORDER — FENTANYL CITRATE 0.05 MG/ML IJ SOLN
INTRAMUSCULAR | Status: AC
  Filled 2013-04-23: qty 6

## 2013-04-23 MED ORDER — IOHEXOL 300 MG/ML  SOLN
10.0000 mL | Freq: Once | INTRAMUSCULAR | Status: AC | PRN
  Administered 2013-04-23: 10 mL

## 2013-04-23 MED ORDER — FENTANYL CITRATE 0.05 MG/ML IJ SOLN
INTRAMUSCULAR | Status: AC | PRN
  Administered 2013-04-23: 50 ug via INTRAVENOUS
  Administered 2013-04-23 (×2): 25 ug via INTRAVENOUS

## 2013-04-23 MED ORDER — MIDAZOLAM HCL 2 MG/2ML IJ SOLN
INTRAMUSCULAR | Status: AC | PRN
  Administered 2013-04-23: 0.5 mg via INTRAVENOUS
  Administered 2013-04-23: 1 mg via INTRAVENOUS
  Administered 2013-04-23: 0.5 mg via INTRAVENOUS

## 2013-04-23 MED ORDER — MIDAZOLAM HCL 2 MG/2ML IJ SOLN
INTRAMUSCULAR | Status: AC
  Filled 2013-04-23: qty 6

## 2013-04-23 MED ORDER — VITAMINS A & D EX OINT
TOPICAL_OINTMENT | CUTANEOUS | Status: AC
  Administered 2013-04-23: 5
  Filled 2013-04-23: qty 5

## 2013-04-23 MED ORDER — TRAZODONE HCL 50 MG PO TABS
50.0000 mg | ORAL_TABLET | Freq: Every evening | ORAL | Status: DC | PRN
  Administered 2013-04-23: 50 mg via ORAL
  Filled 2013-04-23: qty 1

## 2013-04-23 NOTE — Progress Notes (Signed)
Chaplain responded to End of Life order requisition from palliative care. Pt awake and welcomed visit. Chaplain engaged in active/reflective listening as pt shared his story.  Talked about his 59 years of marriage, his son and grand children...and that he will be seeing his wife again before too long. Pt at peace about his prognosis and states he has "lived a good life and knows the Shaune Pollack" and is therefore not afraid to die. Chaplain provided support and encouragement and prayed with pt.  Pt expressed appreciation for the visit.  Rutherford Nail Chaplain

## 2013-04-23 NOTE — Progress Notes (Signed)
  04/23/2013, 8:28 AM  Hospital day 2 Antibiotics: none Chemotherapy:none   Discussed by phone yesterday with IR, who were considering NG decompression prior to G tube, however this was not done overnight.  Note from hospice/ palliative care team seen, to meet with patient and follow up with son.  Subjective: Feeling a little better than on admission, still very dry mouth. Is NPO for G tube,  has not vomited overnight, no abdominal pain after reglan doses (5 mg liquid po). Denies pain. No output from ostomy, but states has voided.  Is cold in room, head under blankets.  Oral hypoglycemic DCd yesterday Son not here yet.  Patient is aware of plan for G tube and Hospice meeting.  Objective: Vital signs in last 24 hours: Blood pressure 103/45, pulse 80, temperature 97.7 F (36.5 C), temperature source Oral, resp. rate 16, height 5\' 7"  (1.702 m), weight 118 lb (53.524 kg), SpO2 96.00%.   Intake/Output from previous day: 10/20 0701 - 10/21 0700 In: 1462.5 [I.V.:1462.5] Out: 575 [Urine:575] Intake/Output this shift:   IVF NS at 75 since admission, to decrease now to 50 cc/hr.  Physical exam: awake, alert, appropriate conversation, smiling and very pleasant. Oral mucosa not as dry. Not icteric. No JVD supine. Lungs without wheezes or rales. Heart RRR no gallop. Peripheral IV site ok. Abdomen soft, quiet, not more distended. Ostomy bag empty. LE with PAS on, no pitting edema or tenderness. Moves all extremities in bed. Neuro nonfocal on bed exam other than chronic decreased hearing.  Lab Results:  Recent Labs  04/22/13 0809  WBC 14.2*  HGB 13.5  HCT 40.1  PLT 381   BMET  Recent Labs  04/22/13 0809 04/23/13 0345  NA 132* 132*  K 5.2* 4.9  CL  --  97  CO2 21* 23  GLUCOSE 125 87  BUN 41.5* 36*  CREATININE 2.2* 1.61*  CALCIUM 9.9 9.2   Note blood sugar just 87 off oral hypoglycemic.  Studies/Results: No results found.   Assessment/Plan: 1.Pancreatic cancer locally  advanced and metastatic to liver: treatment in palliative attempt. With weight loss 33+ lbs and near complete duodenal obstruction is not candidate for even palliative chemo. Code status DNR per patient's request 2.Duodenal obstruction related to nutcracker phenomenon with AAA and SMA. Not appropriate for bowel stent. IR to place G tube for gastric decompression prn 3.dehydration with nonoliguric renal failure: continue gentle IVF today, some better.  4.CKD 5.COPD, long past tobacco 6. Diabetes x 3 years: likely no longer an issue with marked weight loss. Meds DCd. 7. Colon cancer over 20 years ago, treated surgically including colostomy, not known active 8. Decreased hearing since WWII exposure  Plan DC after G tube and when arrangements in place ? Hospice inpatient facility or SNF if [facility] etc not available. Son does not seem able to give level of care at home that he needs now.  [name],[name] [phone] Pager [phone]

## 2013-04-23 NOTE — Progress Notes (Signed)
INITIAL NUTRITION ASSESSMENT  Pt meets criteria for severe MALNUTRITION in the context of chronic illness as evidenced by 22.9% weight loss in the past month with <75% estimated energy intake in the past 2 months.  DOCUMENTATION CODES Per approved criteria  -Severe malnutrition in the context of chronic illness -Underweight   INTERVENTION: - Diet advancement per MD - Nutrition per goals of care/palliative care meeting - Will continue to monitor   NUTRITION DIAGNOSIS: Inadequate oral intake related to inability to eat as evidenced by NPO.   Goal: Comfort PO feeds once diet advanced  Monitor:  Weights, labs, diet advancement  Reason for Assessment: Nutrition   79 y.o. male  Admitting Dx: Uncontrolled symptoms from gastric outlet/duodenal obstruction  ASSESSMENT: Pt discussed during multidisciplinary rounds. Pt with metastatic pancreatic CA with gastric outlet/duodenal obstruction. Per conversation with Dr. Darrold Span, plan is to place G tube for decompression and allow pt to continue to take in liquids as desired after tube placed.   Pt has been seen by outpatient [facility] RD last week who noted pt has had vomiting everyday since August 2nd and pt's weight has gone from 153 pounds 03/06/13 to 118 pounds now.   Met with pt and son. Pt reports vomiting occurred 30 minutes to 2 hours after eating/drinking and son reports sometimes pt would vomit without anything on his stomach. Son reports pt's usual intake would consist of Ensure, Boost, fruit juice, and sometimes some crackers. Pt c/o weakness PTA but denies and falls. Palliative care following.   Sodium low, BUN/Cr elevated with low GFR.   Nutrition Focused Physical Exam:  Subcutaneous Fat:  Orbital Region: mild/moderate wasting Upper Arm Region: mild/moderate wasting Thoracic and Lumbar Region: mild/moderate wasting  Muscle:  Temple Region: mild/moderate wasting Clavicle Bone Region: mild/moderate  wasting Clavicle and Acromion Bone Region: mild/moderate wasting Scapular Bone Region: NA Dorsal Hand: mild/moderate wasting Patellar Region: severe wasting Anterior Thigh Region: severe wasting Posterior Calf Region: NA  Edema: on ankles per son's report   Height: Ht Readings from Last 1 Encounters:  04/22/13 5\' 7"  (1.702 m)    Weight: Wt Readings from Last 1 Encounters:  04/22/13 118 lb (53.524 kg)    Ideal Body Weight: 148 lb  % Ideal Body Weight: 80%  Wt Readings from Last 10 Encounters:  04/22/13 118 lb (53.524 kg)  04/22/13 118 lb 3.2 oz (53.615 kg)  04/15/13 121 lb 4.8 oz (55.021 kg)  04/02/13 130 lb (58.968 kg)  03/06/13 138 lb (62.596 kg)  03/06/12 153 lb 8 oz (69.627 kg)    Usual Body Weight: 158 lb per pt  % Usual Body Weight: 75%  BMI:  Body mass index is 18.48 kg/(m^2). Underweight  Estimated Nutritional Needs: Kcal: 1600-1800 Protein: 80-90g Fluid: 1.6-1.8L/day  Skin: Edema on ankles per son's report   Diet Order: NPO  EDUCATION NEEDS: -No education needs identified at this time   Intake/Output Summary (Last 24 hours) at 04/23/13 0946 Last data filed at 04/23/13 0649  Gross per 24 hour  Intake 1462.5 ml  Output    575 ml  Net  887.5 ml    Last BM: 10/16  Labs:   Recent Labs Lab 04/22/13 0809 04/23/13 0345  NA 132* 132*  K 5.2* 4.9  CL  --  97  CO2 21* 23  BUN 41.5* 36*  CREATININE 2.2* 1.61*  CALCIUM 9.9 9.2  GLUCOSE 125 87    CBG (last 3)   Recent Labs  04/22/13 1723 04/22/13 2101  04/23/13 0817  GLUCAP 79 102* 81    Scheduled Meds: .  ceFAZolin (ANCEF) IV  2 g Intravenous On Call  . influenza vac split quadrivalent PF  0.5 mL Intramuscular Tomorrow-1000  . metoCLOPramide  5 mg Oral TID AC    Continuous Infusions: . sodium chloride 75 mL/hr at 04/22/13 1030    Past Medical History  Diagnosis Date  . AAA (abdominal aortic aneurysm)   . COPD (chronic obstructive pulmonary disease)   . Cancer     Colon  cancer  . Chronic kidney disease   . Arthritis     Past Surgical History  Procedure Laterality Date  . Colon surgery with colostomy  1983  . Cataract extraction, bilateral      Levon Hedger MS, RD, LDN [phone] Pager [phone] After Hours Pager

## 2013-04-23 NOTE — Progress Notes (Signed)
NG tube placed in patient.Checked for placement. Tube is in correct spot.

## 2013-04-23 NOTE — Consult Note (Signed)
Patient VW:UJWJX C [name]      DOB: [birth year]      BJY:[id number]     Consult Note from the Palliative Medicine Team at [facility]    Consult Requested by: Dr Darrold Span     PCP: Shane Housekeeper, MD Reason for Consultation: Clarification of GOC and options     Phone Number:[phone]  Assessment of patients Current state: Shane [name] is a 79 yo man with recently diagnosed metastatic pancreatic cancer to liver, admitted from [facility] with uncontrolled symptoms from gastric outlet/ duodenal obstruction related to the cancer and/or "nutcracker" compression between AAA and SMA. He is unable to manage adequate oral hydration, as evidenced by weight loss of 30 lbs since early August 2014 and additional weight loss of another 3 lbs since 04-15-13 despite IVF at [facility] also on 04-15-13. He has been evaluated by Dr Danise Edge for the gastric outlet type obstruction, which is not amenable to stenting as it is external to GI tract; he may have some element of gastroparesis in addition.   Patient understands his overall poor prognosis and wishes for full comfort path  CT 03-26-13  Interval development of a 5 x 2.4 cm pancreatic tail mass with  obstruction of the splenic vein with prominent varices extending  anterior aspect of the abdomen. Findings highly suspicious for  malignancy possibly primary pancreatic malignancy although  metastatic disease from colon cancer or renal cancer not entirely  excluded. The stomach and duodenum are dilated to level of the ligament of  Treitz where pancreatic tumor may cause a stricture.  Interval development of ascites and peritoneal infiltration  suggestive of peritoneal spread of tumor.  Interval development of liver lesions largest in the right lobe  measuring up to 2 cm consistent with metastatic disease.  Right lung base 5.7 mm nodule previously not imaged. Metastatic  disease not excluded.  Lower pole right renal mass measuring up to 3.3 cm  appears  relatively similar to the prior exam and is suspicious for slow  growing malignancy.  Prior distal colon resection with colostomy formation left lower  quadrant. Difficult to assess for bowel inflammatory process given  the above described findings. No free intraperitoneal air.  Prominent coronary artery calcifications. Prominent irregular plaque  of the abdominal aorta with abdominal aortic aneurysm measuring 4 x  3.6 cm versus prior 3.9 x 3.4 cm. Atherosclerosis with narrowing of  the iliac arteries.   Consult is for review of medical treatment options, clarification of goals of care and end of life issues, disposition and options, and symptom recommendation.  This NP Lorinda Creed reviewed medical records, received report from team, assessed the patient and then meet at the patient's bedside and spoke by telephone with his son Shane [name] #  [phone]  to discuss diagnosis prognosis, GOC, EOL wishes disposition and options.   A detailed discussion was had today regarding advanced directives.  Concepts specific to code status, artifical feeding and hydration, continued IV antibiotics and rehospitalization was had.  The difference between a aggressive medical intervention path  and a palliative comfort care path for this patient at this time was had.  Values and goals of care important to patient and family were attempted to be elicited.  Concept of Hospice and Palliative Care were discussed  Natural trajectory and expectations at EOL were discussed.  Questions and concerns addressed. . Family encouraged to call with questions or concerns.  PMT will continue to support holistically.      Goals of Care:  1.  Code Status: DNR/DNI-comfort is main focus of care   2. Scope of Treatment: 1. Vital Signs:daily  2. Respiratory/Oxygen:for comfort only 3. Nutritional Support/Tube Feeds:no artificail feeding now or in the future 4. Antibiotics: none  5. Blood Products:none 6. IVF: KVO  for medications 7. Review of Medications to be discontinued: minimize for comfort 8. Labs: none once discharged 9. Telemetry: none 10. Consults:none, no further workups   4. Disposition:  Re-meet with patient and son in the morning for further discussion.     3. Symptom Management:   1. Anxiety/Agitation:Ativan 1 mg every 6 hrs prn 2. Pain/Dyspnea: Morphine 1 mg every 2 hrs prn 3. Nausea/Vomiting:        Placement of venting gastrostomy for palliation.  Spoke to IR, plan is for tentative placement today 4. Weakness/Failure to thrive  4. Psychosocial:  Emotional support offered to patient and his son.  Patient verblizes understanding of his limited prognosis and a sense of peace about this time, "I have had a great life and I'm ready"  5. Spiritual:  Chaplin services consulted  6.  Re meet with this NP in the morning at 0830 for discussion regarding anticipatory care needs.    Brief HPI: Shane [name] is a 79 yo man with recently diagnosed metastatic pancreatic cancer to liver, admitted from [facility] with uncontrolled symptoms from gastric outlet/ duodenal obstruction related to the cancer and/or "nutcracker" compression between AAA and SMA. He is unable to manage adequate oral hydration, as evidenced by weight loss of 30 lbs since early August 2014 and additional weight loss of another 3 lbs since 04-15-13 despite IVF at [facility] also on 04-15-13. He has been evaluated by Dr Danise Edge for the gastric outlet type obstruction, which is not amenable to stenting as it is external to GI tract; he may have some element of gastroparesis in addition.   Patient understands his overall poor prognosis and wishes for full comfort path     ROS: weakness, intermittent nausea    PMH:  Past Medical History  Diagnosis Date  . AAA (abdominal aortic aneurysm)   . COPD (chronic obstructive pulmonary disease)   . Cancer     Colon cancer  . Chronic kidney disease   . Arthritis       PSH: Past Surgical History  Procedure Laterality Date  . Colon surgery with colostomy  1983  . Cataract extraction, bilateral     I have reviewed the FH and SH and  If appropriate update it with new information. No Known Allergies Scheduled Meds: .  ceFAZolin (ANCEF) IV  2 g Intravenous On Call  . metoCLOPramide  5 mg Oral TID AC   Continuous Infusions: . sodium chloride 50 mL/hr at 04/23/13 0950   PRN Meds:.acetaminophen, acetaminophen, BIOTENE MOISTURIZING MOUTH, LORazepam, ondansetron (ZOFRAN) IV, ondansetron, ondansetron, oxycodone, traZODone    BP 103/45  Pulse 80  Temp(Src) 97.7 F (36.5 C) (Oral)  Resp 16  Ht 5\' 7"  (1.702 m)  Wt 53.524 kg (118 lb)  BMI 18.48 kg/m2  SpO2 96%   PPS:30 %   Intake/Output Summary (Last 24 hours) at 04/23/13 1232 Last data filed at 04/23/13 0953  Gross per 24 hour  Intake 1462.5 ml  Output    575 ml  Net  887.5 ml   ZOX:[id number]                         Physical Exam:  General: NAD, chronically ill  appearing HEENT:  Dry buccal membranes, no exudate Chest:   Diminished in bases CTA CVS: RRR Abdomen: firm, decreased BS, noted colostomy bag Ext:  Without edema Neuro: moves all 4 extremitiers Psych: alert and oriented X3, with full medical decsion making capacity  Labs: CBC    Component Value Date/Time   WBC 14.2* 04/22/2013 0809   WBC 12.5* 04/02/2013 1300   RBC 4.23 04/22/2013 0809   RBC 4.37 04/02/2013 1300   HGB 13.5 04/22/2013 0809   HGB 13.9 04/02/2013 1300   HCT 40.1 04/22/2013 0809   HCT 40.5 04/02/2013 1300   PLT 381 04/22/2013 0809   PLT 400 04/02/2013 1300   MCV 94.7 04/22/2013 0809   MCV 92.7 04/02/2013 1300   MCH 31.8 04/22/2013 0809   MCH 31.8 04/02/2013 1300   MCHC 33.6 04/22/2013 0809   MCHC 34.3 04/02/2013 1300   RDW 13.8 04/22/2013 0809   RDW 13.7 04/02/2013 1300   LYMPHSABS 0.9 04/22/2013 0809   MONOABS 1.2* 04/22/2013 0809   EOSABS 0.1 04/22/2013 0809   BASOSABS 0.0 04/22/2013 0809     BMET    Component Value Date/Time   NA 132* 04/23/2013 0345   NA 132* 04/22/2013 0809   K 4.9 04/23/2013 0345   K 5.2* 04/22/2013 0809   CL 97 04/23/2013 0345   CO2 23 04/23/2013 0345   CO2 21* 04/22/2013 0809   GLUCOSE 87 04/23/2013 0345   GLUCOSE 125 04/22/2013 0809   BUN 36* 04/23/2013 0345   BUN 41.5* 04/22/2013 0809   CREATININE 1.61* 04/23/2013 0345   CREATININE 2.2* 04/22/2013 0809   CALCIUM 9.2 04/23/2013 0345   CALCIUM 9.9 04/22/2013 0809   GFRNONAA 36* 04/23/2013 0345   GFRAA 41* 04/23/2013 0345    CMP     Component Value Date/Time   NA 132* 04/23/2013 0345   NA 132* 04/22/2013 0809   K 4.9 04/23/2013 0345   K 5.2* 04/22/2013 0809   CL 97 04/23/2013 0345   CO2 23 04/23/2013 0345   CO2 21* 04/22/2013 0809   GLUCOSE 87 04/23/2013 0345   GLUCOSE 125 04/22/2013 0809   BUN 36* 04/23/2013 0345   BUN 41.5* 04/22/2013 0809   CREATININE 1.61* 04/23/2013 0345   CREATININE 2.2* 04/22/2013 0809   CALCIUM 9.2 04/23/2013 0345   CALCIUM 9.9 04/22/2013 0809   PROT 7.2 04/15/2013 1207   ALBUMIN 3.4* 04/15/2013 1207   AST 50* 04/15/2013 1207   ALT 104* 04/15/2013 1207   ALKPHOS 406* 04/15/2013 1207   BILITOT 1.07 04/15/2013 1207   GFRNONAA 36* 04/23/2013 0345   GFRAA 41* 04/23/2013 0345     Time In Time Out Total Time Spent with Patient Total Overall Time  1230 1350 70 min 80 min    Greater than 50%  of this time was spent counseling and coordinating care related to the above assessment and plan.   Lorinda Creed NP  Palliative Medicine Team Team Phone # [phone] Pager [phone]  Discussed with Dr Darrold Span

## 2013-04-23 NOTE — Progress Notes (Signed)
Chaplain responded to request for spiritual care consult.  Pt asleep when I arrived. No family present. Pt woke up but immediately went back to sleep. Follow up when pt is awake is requested.  Rutherford Nail Chaplain

## 2013-04-23 NOTE — Procedures (Signed)
Procedure:  Gastrostomy tube placement Findings:  20 Fr pull-through bumper gastrostomy placed with tube in body of stomach.  OK to use for decompression and other uses.

## 2013-04-23 NOTE — Consult Note (Signed)
Agree 

## 2013-04-24 DIAGNOSIS — J449 Chronic obstructive pulmonary disease, unspecified: Secondary | ICD-10-CM

## 2013-04-24 LAB — GLUCOSE, CAPILLARY
Glucose-Capillary: 151 mg/dL — ABNORMAL HIGH (ref 70–99)
Glucose-Capillary: 130 mg/dL — ABNORMAL HIGH (ref 70–99)

## 2013-04-24 MED ORDER — LORAZEPAM 2 MG/ML IJ SOLN
1.0000 mg | Freq: Every day | INTRAMUSCULAR | Status: DC
  Administered 2013-04-24 – 2013-04-26 (×3): 1 mg via INTRAVENOUS
  Filled 2013-04-24 (×3): qty 1

## 2013-04-24 NOTE — Progress Notes (Signed)
  Subjective: Pt doing ok; has some soreness at G tube site as expected; denies N/V  Objective: Vital signs in last 24 hours: Temp:  [97.6 F (36.4 C)-98.5 F (36.9 C)] 98.5 F (36.9 C) (10/22 0615) Pulse Rate:  [90-102] 95 (10/22 0615) Resp:  [13-25] 16 (10/22 0615) BP: (93-143)/(48-77) 96/48 mmHg (10/22 0615) SpO2:  [93 %-98 %] 97 % (10/22 0615) Last BM Date: 04/22/13  Intake/Output from previous day: 10/21 0701 - 10/22 0700 In: 1295.8 [I.V.:1295.8] Out: 1275 [Urine:925; Drains:350] Intake/Output this shift: Total I/O In: 720 [P.O.:720] Out: 100 [Drains:100] G tube intact, connected to wall suction, insertion site ok, mild to mod tender, no signs of leakage, abd ND  Lab Results:   Recent Labs  04/22/13 0809  WBC 14.2*  HGB 13.5  HCT 40.1  PLT 381   BMET  Recent Labs  04/22/13 0809 04/23/13 0345  NA 132* 132*  K 5.2* 4.9  CL  --  97  CO2 21* 23  GLUCOSE 125 87  BUN 41.5* 36*  CREATININE 2.2* 1.61*  CALCIUM 9.9 9.2   PT/INR  Recent Labs  04/23/13 0345  LABPROT 14.0  INR 1.10   ABG No results found for this basename: PHART, PCO2, PO2, HCO3,  in the last 72 hours  Studies/Results: No results found.  Anti-infectives: Anti-infectives   Start     Dose/Rate Route Frequency Ordered Stop   04/23/13 0600  ceFAZolin (ANCEF) IVPB 2 g/50 mL premix     2 g 100 mL/hr over 30 Minutes Intravenous On call 04/22/13 1640 04/23/13 1712      Assessment/Plan: s/p decompressive gastrostomy tube placement 10/21; ok to use tube; other plans /pain control per primary service  LOS: 2 days    Linnet Bottari,D [facility] 04/24/2013

## 2013-04-24 NOTE — Progress Notes (Signed)
04/24/2013, 8:12 AM  Hospital day 3 Antibiotics: (ancef around procedure 04-23-13) Chemotherapy: none    Subjective: Pain from G tube up to chest, to left shoulder and back now. "Hurt a lot" after placement yesterday, pain medication during night "helped a little while" ( IV MS 1 mg just after MN). Able to eat full liquids this AM (grits, ice cream, OJ) and had 3 cups of ice last pm. Denies SOB. PAS not uncomfortable.  Son not here presently.  MD discussed pain management and G tube with RN at bedside now, 1 mg IV MS given. MD discussed situation with hospice liason on unit now, with further discussion with patient and son planned today and consideration of [employer].  Objective: Vital signs in last 24 hours: Blood pressure 96/48, pulse 95, temperature 98.5 F (36.9 C), temperature source Oral, resp. rate 16, height 5\' 7"  (1.702 m), weight 118 lb (53.524 kg), SpO2 97.00%.   Intake/Output from previous day: 10/21 0701 - 10/22 0700 In: 1295.8 [I.V.:1295.8] Out: 1275 [Urine:925; Drains:350] Intake/Output this shift: Total I/O In: 720 [P.O.:720] Out: 100 [Drains:100]  Physical exam: awake, alert, responds appropriately, looks weak/ frail/cachectic.Looks uncomfortable but not acute distress lying supine very still. Respirations not labored RA supine. Oral mucosa still dry, biotene spray at bedside. PERRL, not icteric. Hearing aides in. Lungs without wheezes or rales. Heart RRR, no gallop. Abdomen quiet, soft, some tenderness around G tube dressing, which is dry. G tube hooked to intermittent suction, full canister emptied this AM per RN, minimal nonbloody fluid in tubing now. Colostomy bag empty. Peripheral IV in UE site ok. LE with PAS on, no swelling or tenderness. Feet warm. Moves all extremities in bed.   Lab Results:  Recent Labs  04/22/13 0809  WBC 14.2*  HGB 13.5  HCT 40.1  PLT 381   BMET  Recent Labs  04/22/13 0809 04/23/13 0345  NA 132* 132*  K 5.2* 4.9  CL   --  97  CO2 21* 23  GLUCOSE 125 87  BUN 41.5* 36*  CREATININE 2.2* 1.61*  CALCIUM 9.9 9.2   CBG this am 151, yesterday 80-92.   Studies/Results: PERCUTANEOUS GASTROSTOMY TUBE PLACEMENT  04-24-13 MEDICATIONS:  2 g IV Ancef. As antibiotic prophylaxis, Ancef was ordered  pre-procedure and administered intravenously within one hour of  incision.  ANESTHESIA/SEDATION:  2.0 mg IV Versed; 100 mcg IV Fentanyl.  Total Moderate Sedation Time  25 minutes.  CONTRAST: 10mL OMNIPAQUE IOHEXOL 300 MG/ML SOLN  FLUOROSCOPY TIME: 6 min and 6 seconds.  PROCEDURE:  The procedure, risks, benefits, and alternatives were explained to  the patient. Questions regarding the procedure were encouraged and  answered. The patient understands and consents to the procedure.  A nasogastric tube was placed several hr prior to the procedure to  decompress the stomach. A 5-French catheter was then advanced  through the patient's mouth under fluoroscopy into the esophagus and  to the level of the stomach. This catheter was used to insufflate  the stomach with air under fluoroscopy.  The abdominal wall was prepped with Betadine in a sterile fashion,  and a sterile drape was applied covering the operative field. A  sterile gown and sterile gloves were used for the procedure. Local  anesthesia was provided with 1% Lidocaine.  A skin incision was made in the upper abdominal wall. Under  fluoroscopy, an 18 gauge trocar needle was advanced into the  stomach. Contrast injection was performed to confirm intraluminal  position of the needle tip.  A single T tack was then deployed in the  lumen of the stomach. This was brought up to tension at the skin  surface.  Over a guidewire, a 9-French sheath was advanced into the lumen of  the stomach. The wire was left in place as a safety wire. A loop  snare device from a percutaneous gastrostomy kit was then advanced  into the stomach.  A floppy guide wire was advanced through  the orogastric catheter  under fluoroscopy in the stomach. The loop snare advanced through  the percutaneous gastric access was used to snare the guide wire.  This allowed withdrawal of the loop snare out of the patient's mouth  by retraction of the orogastric catheter and wire.  A 20-French bumper retention gastrostomy tube was looped around the  snare device. It was then pulled back through the patient's mouth.  The retention bumper was brought up to the anterior gastric wall.  The T tack suture was cut at the skin. The exiting gastrostomy tube  was cut to appropriate length and a feeding adapter applied. The  catheter was injected with contrast material to confirm position and  a fluoroscopic spot image saved. The tube was then flushed with  saline. A dressing was applied over the gastrostomy exit site.  COMPLICATIONS:  None.  FINDINGS:  Initial fluoroscopy demonstrates adequate opacification of the colon  by previously ingested barium at the time of upper GI on 04/16/2013.  The stomach distended well with air allowing safe placement of the  gastrostomy tube. After placement, the tip of the gastrostomy tube  lies in the body of the stomach. The gastrostomy tube was connected  to wall suction. The nasogastric tube was then removed.  IMPRESSION:  Percutaneous gastrostomy with placement of a 20-French bumper  retention tube in the body of the stomach. This will be utilized for  gastric decompression.   Assessment/Plan: 1.Pancreatic cancer locally advanced and metastatic to liver: treatment in palliative attempt. With weight loss 33+ lbs and near complete duodenal obstruction is not candidate for even palliative chemo. Code status DNR per patient's request. Appreciate Hospice assistance. He is in terminal stages tho not actively dying at present, and cannot be adequately cared for at home. Disposition planning in process. He needs to stay in hospital for symptom management until hopefully  inpatient Hospice bed available. Will continue IV morphine prn, G tube suction and minimal IVF for now. 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 Not appropriate for bowel stent.  G tube for gastric decompression placed by IR on 04-23-13. No vomiting since this has been to suction. OK to continue liquids, as these can be suctioned out  3.dehydration with nonoliguric renal failure: continue gentle IVF today, some better.  4.CKD  5.COPD, long past tobacco  6. Diabetes x 3 years: likely no longer an issue with marked weight loss. Meds DCd. Prn CBGs ok. 7. Colon cancer over 20 years ago, treated surgically including colostomy, not known active  8. Decreased hearing since WWII exposure    Lyrick Worland P [phone]

## 2013-04-24 NOTE — Progress Notes (Signed)
Progress Note from the Palliative Medicine Team at [facility]  Subjective: - patient is alert and wake, reports relief of abdominal pain from IV morphine  -reports "I can't sleep a wink", tells me he cannot fall asleep at night  -son at bedside, continued conversation regarding  diagnosis prognosis, GOC, EOL wishes disposition and options.    Concepts specific to code status, artifical feeding and hydration, continued IV antibiotics and rehospitalization was had.   Values and goals of care important to patient and family were  Elicited.  Patient wants only comfort, quality and dignity at this time in his life, understanding his prognosis of likley four to six weeks.     Objective: No Known Allergies Scheduled Meds:  Continuous Infusions: . sodium chloride 50 mL/hr at 04/24/13 0730   PRN Meds:.acetaminophen, acetaminophen, BIOTENE MOISTURIZING MOUTH, LORazepam, morphine injection, ondansetron (ZOFRAN) IV, ondansetron, ondansetron, oxycodone, traZODone  BP 96/48  Pulse 95  Temp(Src) 98.5 F (36.9 C) (Oral)  Resp 16  Ht 5\' 7"  (1.702 m)  Wt 53.524 kg (118 lb)  BMI 18.48 kg/m2  SpO2 97%   PPS: 20 %  Pain Score: comfortable presently, requiring IV medication for symptom control   Intake/Output Summary (Last 24 hours) at 04/24/13 0845 Last data filed at 04/24/13 0730  Gross per 24 hour  Intake 2015.83 ml  Output   1375 ml  Net 640.83 ml       Physical Exam: General: NAD, chronically ill appearing, weak  HEENT: Dry buccal membranes, no exudate  Chest: Diminished in bases CTA  CVS: RRR  Abdomen: firm, decreased BS, noted colostomy bag and PEG tube, dressing dry and intact Ext: Without edema  Neuro: moves all 4 extremitiers  Psych: alert and oriented X3, with full medical decsion making capacity   Labs: CBC    Component Value Date/Time   WBC 14.2* 04/22/2013 0809   WBC 12.5* 04/02/2013 1300   RBC 4.23 04/22/2013 0809   RBC 4.37 04/02/2013 1300   HGB 13.5 04/22/2013  0809   HGB 13.9 04/02/2013 1300   HCT 40.1 04/22/2013 0809   HCT 40.5 04/02/2013 1300   PLT 381 04/22/2013 0809   PLT 400 04/02/2013 1300   MCV 94.7 04/22/2013 0809   MCV 92.7 04/02/2013 1300   MCH 31.8 04/22/2013 0809   MCH 31.8 04/02/2013 1300   MCHC 33.6 04/22/2013 0809   MCHC 34.3 04/02/2013 1300   RDW 13.8 04/22/2013 0809   RDW 13.7 04/02/2013 1300   LYMPHSABS 0.9 04/22/2013 0809   MONOABS 1.2* 04/22/2013 0809   EOSABS 0.1 04/22/2013 0809   BASOSABS 0.0 04/22/2013 0809    BMET    Component Value Date/Time   NA 132* 04/23/2013 0345   NA 132* 04/22/2013 0809   K 4.9 04/23/2013 0345   K 5.2* 04/22/2013 0809   CL 97 04/23/2013 0345   CO2 23 04/23/2013 0345   CO2 21* 04/22/2013 0809   GLUCOSE 87 04/23/2013 0345   GLUCOSE 125 04/22/2013 0809   BUN 36* 04/23/2013 0345   BUN 41.5* 04/22/2013 0809   CREATININE 1.61* 04/23/2013 0345   CREATININE 2.2* 04/22/2013 0809   CALCIUM 9.2 04/23/2013 0345   CALCIUM 9.9 04/22/2013 0809   GFRNONAA 36* 04/23/2013 0345   GFRAA 41* 04/23/2013 0345    CMP     Component Value Date/Time   NA 132* 04/23/2013 0345   NA 132* 04/22/2013 0809   K 4.9 04/23/2013 0345   K 5.2* 04/22/2013 0809   CL 97 04/23/2013  0345   CO2 23 04/23/2013 0345   CO2 21* 04/22/2013 0809   GLUCOSE 87 04/23/2013 0345   GLUCOSE 125 04/22/2013 0809   BUN 36* 04/23/2013 0345   BUN 41.5* 04/22/2013 0809   CREATININE 1.61* 04/23/2013 0345   CREATININE 2.2* 04/22/2013 0809   CALCIUM 9.2 04/23/2013 0345   CALCIUM 9.9 04/22/2013 0809   PROT 7.2 04/15/2013 1207   ALBUMIN 3.4* 04/15/2013 1207   AST 50* 04/15/2013 1207   ALT 104* 04/15/2013 1207   ALKPHOS 406* 04/15/2013 1207   BILITOT 1.07 04/15/2013 1207   GFRNONAA 36* 04/23/2013 0345   GFRAA 41* 04/23/2013 0345         Assessment and Plan: 1. Code Status: DNR/DNI-comfort is min focus of care 2. Symptom Control:         Pain/Dyspnea: Morphine 1 mg every 2 hrs prn         Insomnia: Ativan 1 mg every qhs          Anxiety/Agitation:Ativan 1 mg every 6 hrs prn          Nausea/Vomiting:  G-tube to constant suction, patient is able to takes sips for comfort 3. Psycho/Social: Emotional support offeredboth patient and his son verbalize understanding of limited prognosis.,  4. Spiritual  Spiritual care involved,  5. Disposition: Hopeful for residential hospice, will write for choice   Patient Documents Completed or Given: Document Given Completed  Advanced Directives Pkt    MOST    DNR    Gone from My Sight    Hard Choices yes     Time In Time Out Total Time Spent with Patient Total Overall Time  0830 0915 40 min 45 min    Greater than 50%  of this time was spent counseling and coordinating care related to the above assessment and plan.  Lorinda Creed NP  Palliative Medicine Team Team Phone # [phone] Pager [phone]  Discussed with Dr Darrold Span 1

## 2013-04-24 NOTE — Progress Notes (Signed)
Clinical Social Work Department BRIEF PSYCHOSOCIAL ASSESSMENT 04/24/2013  Patient:  Shane [name], Shane [name]     Account Number:  [id number]     Admit date:  04/22/2013  Clinical Social Worker:  Jacelyn Grip  Date/Time:  04/24/2013 09:30 AM  Referred by:  Physician  Date Referred:  04/24/2013 Referred for  SNF Placement   Other Referral:   Interview type:  Patient Other interview type:   and patient son at bedside    PSYCHOSOCIAL DATA Living Status:  FAMILY Admitted from facility:   Level of care:   Primary support name:  Shane [name] [phone] Primary support relationship to patient:  CHILD, ADULT Degree of support available:   adequate    CURRENT CONCERNS Current Concerns  Post-Acute Placement   Other Concerns:    SOCIAL WORK ASSESSMENT / PLAN CSW received referral for residential hospice placement.    CSW met with pt and pt son at bedside to discuss. Pt confirmed interest in exploring residential hospice placement. CSW provided pt and pt son residential hospice list. Pt chooses bed at [facility]. CSW encouraged pt and pt son to review residential hospice list to have secondary options in mind if needed.    CSW made referral to [facility], [facility]ine Him.    CSW to continue to follow to assist with residential hospice placement.   Assessment/plan status:  Psychosocial Support/Ongoing Assessment of Needs Other assessment/ plan:   discharge planning   Information/referral to community resources:   Residential hospice list    PATIENT'S/FAMILY'S RESPONSE TO PLAN OF CARE: Pt alert and oriented x 4. Pt and pt son appear to be coping appropriately with transition to residential hospice. Pt and pt son are very hopeful that pt will be able to go to [facility].     Shane [name], MSW, LCSWA  Clinical Social Work [phone]

## 2013-04-24 NOTE — Care Management Note (Signed)
   CARE MANAGEMENT NOTE 04/24/2013  Patient:  Shane [name], Shane [name]   Account Number:  [id number]  Date Initiated:  04/24/2013  Documentation initiated by:  Zuri Lascala  Subjective/Objective Assessment:   79 yo male admitted with gastric outlet obstruction. Hx of pancreatic ca, Oncologist Dr. Darrold Span.     Action/Plan:   [facility]   Anticipated DC Date:     Anticipated DC Plan:  HOSPICE MEDICAL FACILITY      DC Planning Services  CM consult      Choice offered to / List presented to:  NA   DME arranged  NA      DME agency  NA     HH arranged  NA      HH agency  NA   Status of service:  In process, will continue to follow Medicare Important Message given?   (If response is "NO", the following Medicare IM given date fields will be blank) Date Medicare IM given:   Date Additional Medicare IM given:    Discharge Disposition:    Per UR Regulation:  Reviewed for med. necessity/level of care/duration of stay  If discussed at Long Length of Stay Meetings, dates discussed:    Comments:  04/24/13 1337 Tresea Heine,RN,MSN [phone] Chart reviewed for utilization of srvices.Potential tx to [facility]. CSw following.

## 2013-04-25 DIAGNOSIS — F411 Generalized anxiety disorder: Secondary | ICD-10-CM

## 2013-04-25 MED ORDER — SODIUM CHLORIDE 0.9 % IJ SOLN
10.0000 mL | Freq: Two times a day (BID) | INTRAMUSCULAR | Status: DC
  Administered 2013-04-25 – 2013-04-27 (×3): 10 mL

## 2013-04-25 MED ORDER — SODIUM CHLORIDE 0.9 % IJ SOLN: 10.0000 mL | INTRAMUSCULAR | Status: DC | PRN

## 2013-04-25 NOTE — Progress Notes (Signed)
Progress Note from the Palliative Medicine Team at [facility]  Subjective:  -patient is alert and oriented X3, continues to verblize a sense of peace regarding his limited prognosis  -he reports "slept much better" with the Ativan 1 mg qhs  -son at bedside verbalizing questions and concerns regarding disposition   Objective: No Known Allergies Scheduled Meds: . LORazepam  1 mg Intravenous QHS   Continuous Infusions: . sodium chloride 50 mL/hr at 04/25/13 0349   PRN Meds:.acetaminophen, BIOTENE MOISTURIZING MOUTH, LORazepam, morphine injection, ondansetron (ZOFRAN) IV, ondansetron  BP 99/56  Pulse 81  Temp(Src) 97.8 F (36.6 C) (Oral)  Resp 16  Ht 5\' 7"  (1.702 m)  Wt 53.524 kg (118 lb)  BMI 18.48 kg/m2  SpO2 93%   PPS:20 %  Pain Score: denies presetnly   Intake/Output Summary (Last 24 hours) at 04/25/13 1133 Last data filed at 04/25/13 0600  Gross per 24 hour  Intake   1180 ml  Output   2100 ml  Net   -920 ml       Physical Exam:  General: NAD, chronically ill appearing, weaker today  HEENT: Dry buccal membranes, no exudate  Chest: Diminished in bases CTA  CVS: RRR  Abdomen: soft, decreased BS, noted colostomy bag and PEG tube, dressing dry and intact     -suction canister fills frequently, patient is able to take po fluids for comfort with stomach content evacuated to suction canister  Ext: Without edema  Neuro: moves all 4 extremitiers  Psych: alert and oriented X3, with full medical decsion making capacity     Labs: CBC    Component Value Date/Time   WBC 14.2* 04/22/2013 0809   WBC 12.5* 04/02/2013 1300   RBC 4.23 04/22/2013 0809   RBC 4.37 04/02/2013 1300   HGB 13.5 04/22/2013 0809   HGB 13.9 04/02/2013 1300   HCT 40.1 04/22/2013 0809   HCT 40.5 04/02/2013 1300   PLT 381 04/22/2013 0809   PLT 400 04/02/2013 1300   MCV 94.7 04/22/2013 0809   MCV 92.7 04/02/2013 1300   MCH 31.8 04/22/2013 0809   MCH 31.8 04/02/2013 1300   MCHC 33.6 04/22/2013 0809    MCHC 34.3 04/02/2013 1300   RDW 13.8 04/22/2013 0809   RDW 13.7 04/02/2013 1300   LYMPHSABS 0.9 04/22/2013 0809   MONOABS 1.2* 04/22/2013 0809   EOSABS 0.1 04/22/2013 0809   BASOSABS 0.0 04/22/2013 0809    BMET    Component Value Date/Time   NA 132* 04/23/2013 0345   NA 132* 04/22/2013 0809   K 4.9 04/23/2013 0345   K 5.2* 04/22/2013 0809   CL 97 04/23/2013 0345   CO2 23 04/23/2013 0345   CO2 21* 04/22/2013 0809   GLUCOSE 87 04/23/2013 0345   GLUCOSE 125 04/22/2013 0809   BUN 36* 04/23/2013 0345   BUN 41.5* 04/22/2013 0809   CREATININE 1.61* 04/23/2013 0345   CREATININE 2.2* 04/22/2013 0809   CALCIUM 9.2 04/23/2013 0345   CALCIUM 9.9 04/22/2013 0809   GFRNONAA 36* 04/23/2013 0345   GFRAA 41* 04/23/2013 0345    CMP     Component Value Date/Time   NA 132* 04/23/2013 0345   NA 132* 04/22/2013 0809   K 4.9 04/23/2013 0345   K 5.2* 04/22/2013 0809   CL 97 04/23/2013 0345   CO2 23 04/23/2013 0345   CO2 21* 04/22/2013 0809   GLUCOSE 87 04/23/2013 0345   GLUCOSE 125 04/22/2013 0809   BUN 36* 04/23/2013 0345   BUN  41.5* 04/22/2013 0809   CREATININE 1.61* 04/23/2013 0345   CREATININE 2.2* 04/22/2013 0809   CALCIUM 9.2 04/23/2013 0345   CALCIUM 9.9 04/22/2013 0809   PROT 7.2 04/15/2013 1207   ALBUMIN 3.4* 04/15/2013 1207   AST 50* 04/15/2013 1207   ALT 104* 04/15/2013 1207   ALKPHOS 406* 04/15/2013 1207   BILITOT 1.07 04/15/2013 1207   GFRNONAA 36* 04/23/2013 0345   GFRAA 41* 04/23/2013 0345      Assessment and Plan: 1. Code Status: DNR/DNI-comfort is main focus of care 2. Symptom Control: Pain/Dyspnea: Morphine 1 mg every 2 hrs prn  Insomnia: Ativan 1 mg every qhs  Anxiety/Agitation:Ativan 1 mg every 6 hrs prn  Nausea/Vomiting: G-tube to constant suction, patient is able to takes sips for comfort  **Recommend placement of PICC for anticipated significant symptom management for use at residential hospice**  3. Psycho/Social:  Emotional support offered to  patient and his son.  Main area of concern is with disposition plan 4. Disposition:  Hopeful for residential hospice.  Patient Documents Completed or Given: Document Given Completed  Advanced Directives Pkt    MOST    DNR    Gone from My Sight    Hard Choices yes     Time In Time Out Total Time Spent with Patient Total Overall Time  1000 1030 25 min 30 min    Greater than 50%  of this time was spent counseling and coordinating care related to the above assessment and plan.  Lorinda Creed NP  Palliative Medicine Team Team Phone # [phone] Pager [phone]  Discussed with Dr Darrold Span 1

## 2013-04-25 NOTE — Progress Notes (Signed)
  04/25/2013, 8:14 AM  Hospital day 4 Antibiotics: none Chemotherapy: none    Subjective: Rouses easily to voice, more comfortable this AM and was able to sleep some last night. Pain medication (prn IV morphine) helpful. Using biotene spray and ice for dry mouth, does not feel as thirsty as PTA.  Suction to G tube not uncomfortable. No nausea or vomiting now. Taking liquids po, which he is tolerating with G tube to intermittent suction. Family not here presently.  Objective: Vital signs in last 24 hours: Blood pressure 99/56, pulse 81, temperature 97.8 F (36.6 C), temperature source Oral, resp. rate 16, height 5\' 7"  (1.702 m), weight 118 lb (53.524 kg), SpO2 93.00%.   Intake/Output from previous day: 10/22 0701 - 10/23 0700 In: 1900 [[name].O.:1080; I.V.:400] Out: 2425 [Urine:775; Drains:1650] Intake/Output this shift:    Physical exam: Frail, cachectic but looks more relaxed and comfortable today, respirations not labored supine on RA. PERRL, not icteric. Dentures in, oral mucosa extremely dry without lesions. Lungs clear anteriorly and laterally. Peripheral IV with NS at 50. Heart RRR. Abdomen scaphoid, poor skin turgor. G tube dressing clean and dry, cannister recently emptied with small amount dark clear fluid now, still on intermittent suction. Ostomy bag empty. LE no edema, cords, tenderness, warm. Moves all extremities in bed.   Lab Results: No results found for this basename: WBC, HGB, HCT, PLT,  in the last 72 hours BMET  Recent Labs  04/23/13 0345  NA 132*  K 4.9  CL 97  CO2 23  GLUCOSE 87  BUN 36*  CREATININE 1.61*  CALCIUM 9.2   CBGs 97-150 yesterday off medication  No further labs ordered.  Studies/Results: None new  Notes from M.Konrad Dolores and CSW reviewed. We are hopeful that inpatient Hospice facility will be available, as   Assessment/Plan: 1.Pancreatic cancer locally advanced and metastatic to liver: treatment in palliative attempt. With weight loss 33+  lbs and near complete duodenal obstruction is not candidate for even palliative chemo. Code status DNR per patient's request. Symptoms better controlled now with G tube to intermittent suction, IVF due to extreme thirst on admission, prn IV morphine and ativan. He is in terminal stages tho not actively dying at present, and cannot be adequately cared for at home. Disposition planning in process. He needs to stay in hospital for symptom management until hopefully inpatient Hospice bed available. Will continue IV morphine prn, G tube suction and minimal IVF for now.  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 Not appropriate for bowel stent. G tube for gastric decompression placed by IR on 04-23-13. No vomiting since this has been to suction. OK to continue liquids, as these can be suctioned out  3.dehydration with nonoliguric renal failure:  some better by labs since admission. No further labs planned. 4.CKD  5.COPD, long past tobacco  6. Diabetes x 3 years: likely no longer an issue with marked weight loss. Meds DCd. Prn CBGs ok.  7. Colon cancer over 20 years ago, treated surgically including colostomy, not known active  8. 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

## 2013-04-25 NOTE — Progress Notes (Signed)
CSW continuing to follow for residential hospice placement.  CSW received notification from [facility], [facility]ine Him that [facility] has no bed availability today or tomorrow and no bed availability anticipated during the weekend at this time.  CSW and PMT NP, Lorinda Creed met with pt and pt son at bedside to discuss. CSW discussed the update this CSW received from [facility] and discussed that given that at this time it is unknown how long pt will meet medical criteria to be in the hospital and that [facility] is unable to provide an anticipate time that bed will be available that it will be necessary to explore other residential hospice facilities.   Pt son expressed understanding, but states that it is not ideal for pt son and PMT NP, Lorinda Creed explained that the care at residential hospice is what the patient needs at this time as no other type of facility will be able to meet pt care needs. Pt son discussed that he will further discuss with pt, but likely second choice will be [facility].  Pt son requested that CSW follow up tomorrow morning to discuss and then CSW can proceed with exploring secondary options.  CSW to continue to follow.  [name], MSW, LCSWA  Clinical Social Work [phone]

## 2013-04-25 NOTE — Progress Notes (Signed)
Peripherally Inserted Central Catheter/Midline Placement  The IV Nurse has discussed with the patient and/or persons authorized to consent for the patient, the purpose of this procedure and the potential benefits and risks involved with this procedure.  The benefits include less needle sticks, lab draws from the catheter and patient may be discharged home with the catheter.  Risks include, but not limited to, infection, bleeding, blood clot (thrombus formation), and puncture of an artery; nerve damage and irregular heat beat.  Alternatives to this procedure were also discussed.  PICC/Midline Placement Documentation        Lisabeth Devoid 04/25/2013, 1:58 PM Consent obtained by Lazarus Gowda , RN

## 2013-04-26 ENCOUNTER — Telehealth: Payer: Self-pay

## 2013-04-26 NOTE — Progress Notes (Signed)
  04/26/2013, 8:33 AM  Hospital day 5 Antibiotics: none Chemotherapy: none    Subjective: Rouses to voice, has to moisten mouth with ice chips before able to talk. Reports "soreness" around G tube, last IV morphine during night. PICC placed yesterday in anticipation of need at Hospice inpatient facility after DC. Patient drinking fluids and enjoyed strawberry ice cream yesterday. We discussed air overlay mattress if present mattress not comfortable.  Spoke with son, who has arrived on unit now. All of the logistics of locating an appropriate inpatient Hospice facility are causing significant and unnecessary stress and anxiety for family. Obviously patient needs ongoing terminal care for symptom control, either in hospital or in Hospice inpatient facility, and I have assured son and patient that he will be cared for appropriately.   Objective: Vital signs in last 24 hours: Blood pressure 101/52, pulse 80, temperature 98 F (36.7 C), temperature source Oral, resp. rate 14, height 5\' 7"  (1.702 m), weight 118 lb (53.524 kg), SpO2 97.00%.   Intake/Output from previous day: 10/23 0701 - 10/24 0700 In: 1080 [P.O.:660; I.V.:200] Out: 2225 [Urine:825; Drains:1400] Intake/Output this shift:  IVF at [facility]  Physical exam: Obviously weaker today, tho still appropriate conversation and very appreciative of care. PICC line infusing without difficulty RUE, site ok, no swelling RUE. Mouth dry, dentures in, no lesions. Not icteric. Lungs without wheezes or rales. Heart RRR. Abdomen quiet, not distended. G tube cannister full of yellow liquid, dressing dry and clean. Nothing in ostomy bag. LE without pitting edema, right lower leg slightly cooler than left. Marked muscle wasting, cachectic. Moves all extremities in bed.  Lab Results: No labs ordered  Studies/Results: No results found.   Assessment/Plan: 1.Pancreatic cancer locally advanced and metastatic to liver: he is in terminal stages and requires   symptom management that cannot be accomplished adequately out of a hospital or a residential hospice facility. Code status is DNR and all care is in palliative attempt, including G tube drainage of stomach contents, IV morphine and IV ativan. We appreciate continued efforts of hospice liason and CSW, and understand that an appropriate inpatient hospice bed has not yet become available, such that it is medically necessary that terminal care continue in hospital for now. He is progressively declining tho not yet actively dying. G tube and PICC in for symptom management. 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 Not appropriate for bowel stent. G tube for gastric decompression placed by IR on 04-23-13. No vomiting since this has been to suction. OK to continue liquids, as these can be suctioned out  3.dehydration with nonoliguric renal failure on admission related to inability to take po 4.CKD  5.COPD, long past tobacco  6. Diabetes x 3 years: likely no longer an issue with marked weight loss. Meds DCd. Prn CBGs ok.  7. Colon cancer over 20 years ago, treated surgically including colostomy, not known active  8. 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 Out of facility DNR signed now. I will round on him Sat and Sun, and on call MD for medical oncology will cover otherwise over weekend.   Shane [name] P [phone]

## 2013-04-26 NOTE — Progress Notes (Signed)
CSW continuing to follow for residential hospice placement.   CSW received notification from [facility] that [facility] remains full and does not anticipate availability during the weekend.   CSW spoke with MD who discussed that pt symptoms require care in hospital until residential hospice bed becomes available. Rather that is a bed at [facility] or another residential hospice facility.  CSW met with pt and pt son at bedside. Pt resting comfortably at this time. CSW discussed with pt son that [facility] remain full and unknown when bed will become available. Pt son discussed that he received impression from MD that pt could remain in the hospital until a [facility] became available. CSW discussed with pt son that CSW would contact MD to clarify.  CSW spoke with MD via telephone who stated that pt does require residential hospice level of care and that care can be provided in [facility] or another local residential hospice and it would depend on which facility had a bed available for pt first.  CSW met with pt son at bedside and discussed that CSW was able to speak with MD who emphasized that pt does need residential hospice, but residential hospice care can be provided in any local residential hospice facility. CSW discussed with pt son that referring to another residential hospice would not change pt remaining on the waiting list for [facility], but pt would discharge to residential hospice that had first available bed rather that be [facility] or another local residential hospice of pt family choice.  Pt son appeared to express understanding and stated that he would be agreeable to referral being made to [facility] as secondary option.   CSW contacted [facility] and faxed referral.  CSW left message with [facility] liaison as well.  CSW to continue to follow to assist with residential hospice placement.  [name], MSW, LCSWA  Clinical  Social Work [phone]

## 2013-04-26 NOTE — Telephone Encounter (Signed)
Told Mr. Mcmath that [name] CSW  on [place] [address] [place] did speak with Dr. Darrold Span this am.  Dr. Darrold Span is amenable to discharge of father to [facility] or [facility]- whichever facility has availability. Son verbalized understanding.

## 2013-04-27 MED ORDER — HEPARIN SOD (PORK) LOCK FLUSH 100 UNIT/ML IV SOLN
250.0000 [IU] | INTRAVENOUS | Status: AC | PRN
  Administered 2013-04-27: 250 [IU]
  Filled 2013-04-27: qty 5

## 2013-04-27 MED ORDER — WHITE PETROLATUM GEL
Freq: Four times a day (QID) | Status: DC
  Administered 2013-04-27 (×2): 1 via TOPICAL
  Filled 2013-04-27 (×5): qty 5

## 2013-04-27 NOTE — Progress Notes (Signed)
Report called to Marylene Land RN at [facility].  [name], son, at hospice now to meet pt.  Pt's PICC line flushed with heparin per protocol.  Pt denies pain.  G tube irrigated and clamped for transport via ambulance.  Belongings packed.

## 2013-04-27 NOTE — Progress Notes (Signed)
  04/27/2013, 7:41 AM  Hospital day 6 Antibiotics: none Chemotherapy: none    Subjective: Rouses briefly to voice, then back asleep, mouth breathing. No family here presently. "Not too much" pain now. Pulling blankets up, but seems warm now.  Objective: Vital signs in last 24 hours: Blood pressure 97/59, pulse 83, temperature 97.4 F (36.3 C), temperature source Oral, resp. rate 16, height 5\' 7"  (1.702 m), weight 118 lb (53.524 kg), SpO2 97.00%.   Intake/Output from previous day: 10/24 0701 - 10/25 0700 In: 547.7 [P.O.:120; I.V.:427.7] Out: 1820 [Urine:800; Drains:1020] Intake/Output this shift:    Physical exam: Mouth extremely dry. Respirations not labored RA. PICC infusing RUE at [facility]. Yellowish fluid from G tube in cannister. Colostomy bag empty. Heart RRR. Abdomen soft. LE no edema or cords.   Lab Results: No results found for this basename: WBC, HGB, HCT, PLT,  in the last 72 hours BMET No results found for this basename: NA, K, CL, CO2, GLUCOSE, BUN, CREATININE, CALCIUM,  in the last 72 hours  No routine labs including CBGs  Studies/Results: No results found.  Discussed palliative interventions with RN on unit. He may need more frequent pain medication, as he does not request medication. Needs vaseline to lips as dry mouth very bothersome to him.  Assessment/Plan: 1.Pancreatic cancer locally advanced and metastatic to liver: he is in terminal stages and requires symptom management that cannot be accomplished adequately out of a hospital or a residential hospice facility. Code status is DNR and all care is in palliative attempt, including G tube drainage of stomach contents, IV morphine and IV ativan. PICC in for use also at [facility] inpatient facility. Awaiting bed at [facility] inpatient facility. 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 Not appropriate for bowel stent. G tube for gastric decompression placed by IR on 04-23-13. No vomiting  since this has been to suction. OK to continue liquids, as these can be suctioned out  3.dehydration with nonoliguric renal failure on admission related to inability to take po. No longer checking labs. IVF decreased to [facility] by palliative team. 4.CKD  5.COPD, long past tobacco  6. Diabetes x 3 years: likely no longer an issue with marked weight loss. Meds DCd. Prn CBGs ok.  7. Colon cancer over 20 years ago, treated surgically including colostomy, not known active  8. Decreased hearing since WWII exposure  On call MD covering between my rounds this weekend - thank you.  LIVESAY,LENNIS P [phone]

## 2013-04-27 NOTE — Progress Notes (Addendum)
Spoke with Cheri at [facility].  Per Dennard Nip, the facility has a bed available today.  Notified MD on-call, Dr. Marcelyn Ditty.  Dr. Marcelyn Ditty spoke with Dr. Darrold Span, who stated that Pt is ready for d/c; Dr. Darrold Span to do the d/c summary.  Per Dr. Marcelyn Ditty, Pt's son unhappy.  Spoke with Pt's son who was agreeable to admission, although he wasn't happy about it.  Spoke with Pt who was agreeable to transport to [facility].  CSW notified Cheri that Pt ready for d/c today.  Cheri not requesting any information at the outset; Cheri ok with all info being sent with Pt.  Arranged for transportation.  Pt to be d/c'd.  Providence Crosby, LCSWA Clinical Social Work [phone]

## 2013-04-27 NOTE — Discharge Summary (Signed)
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, post G tube for gastric decompression  Active Problems:   Nausea alone   Weakness generalized   Palliative care encounter   Protein-calorie malnutrition, severe   Anxiety state, unspecified Diabetes, no longer requiring medication due to weight loss > 30 lbs and no po intake Abdominal aortic aneurysm, stable History of colon cancer, not known active, post colostomy COPD with long past tobacco Chronic kidney disease Decreased auditory acuity  Discharged Condition: poor but stable for transfer to Hospice inpatient facility for terminal care  Hospital Course:  Shane [name] is a 79 yo man with recently diagnosed metastatic pancreatic cancer to liver, admitted from [facility] with uncontrolled symptoms from gastric outlet/ duodenal obstruction related to the cancer and/or "nutcracker" compression between AAA and SMA. He was unable to manage adequate oral hydration, as evidenced by weight loss of 30+ lbs since early August 2014 and additional weight loss since 04-15-13 despite IVF at [facility] also on 04-15-13. He was evaluated by Dr Danise Edge week prior to admission  for the gastric outlet type obstruction, which is not amenable to stenting as it is external to GI tract; he may have some element of gastroparesis in addition. On admission he was vomiting multiple times daily, very weak and family was unable to manage care at home. Patient was admitted to oncology unit at [facility], code status DNR per our discussion of his wishes. Initial labs showed marked dehydration with worsening of renal insufficiency. He was treated with gentle I hydration  intitally, that decreased during latter part of hospitalization after consultation by Hospice and Palliatie Care. Blood sugars were in adequate range without coverage and regular CBGs were subsequently discontinued. Gastric symptoms did not improve with initial reglan and decision was made to have IR place G tube for symptom management. After G tube drainage was begun, he had no further nausea or vomiting; he did continue to take full liquids po. Dry mouth was particularly bothersome, treated with Biotene spray and lip balm as well as ice chips. He had no significant output from colostomy during hospitalization. He had relief of discomfort with prn IV morphine and prn IV ativan, with request from Hospice liason to place PICC to allow these medications to continue at inpatient Hospice facility. PAS were used for DVT prophylaxis due to procedures and desire to avoid injections in this terminal care situation. Clinical social worker assisted with obtaining bed at inpatient Hospice facility, as patient was clearly declining and will need ongoing care at this level for maintenance of comfort in terminal phases of this illness.  Consults: interventional radiology, Hospice/ Palliative Care, Nutrition, Chaplain  Significant Diagnostic Studies:  Laboratory studies on admission Imaging with gastrostomy tube placement  Treatments: IV hydration, IV pain medication (morphine), IV ativan, G tube placement and intermittent suction, PICC placement for medication administration  Discharge Exam: Blood pressure 97/59, pulse 83, temperature 97.4 F (36.3 C), temperature source Oral, resp. rate 16, height 5\' 7"  (1.702 m), weight 118 lb (53.524 kg), SpO2 97.00%. Cachectic elderly gentleman, rouses briefly to voice, then back asleep. Mouth dry without lesions. No JVD. Respirations not labored supine RA. PERRL, not icteric. PICC RUE infusing at [facility], site not remarkable. Lungs without wheezes or rales. Heart RRR,  no gallop.  Abdomen scaphoid, ostomy bag empty, dressing clean and dry at G tube, some yellow fluid in G tube cannister. LE without pitting edema, cords, tenderness. Moves all extremities in bed. Skin without rash or ecchymosis.  Disposition: anticipated discharge to [facility] ~ 04-27-2013  Medications at discharge will be prn IV morphine, prn IV ativan and oral biotene spray.   Signed: LIVESAY,LENNIS P 04/27/2013, 12:49 PM

## 2013-06-03 DEATH — deceased

## 2013-08-26 ENCOUNTER — Telehealth: Payer: Self-pay

## 2013-08-26 NOTE — Telephone Encounter (Signed)
Patient past away @ [facility] [place] per Iver Nestle in [place]

## 2014-03-19 ENCOUNTER — Other Ambulatory Visit (HOSPITAL_COMMUNITY): Payer: Medicare Other

## 2014-03-19 ENCOUNTER — Ambulatory Visit: Payer: Medicare Other | Admitting: Family

## 2015-08-12 IMAGING — CR DG UGI W/ KUB
18 of 21 series · 18 of 21 positions shown · non-contrast
Comparison: CT scan 03/26/2013.

FLUOROSCOPY TIME:  4 min and 18 seconds

CLINICAL DATA: Vomiting.

EXAM:
UPPER GI SERIES W/ KUB
TECHNIQUE: After obtaining a scout radiograph a routine upper GI series was
performed using thin and high density barium.

[run (1 of 17)]
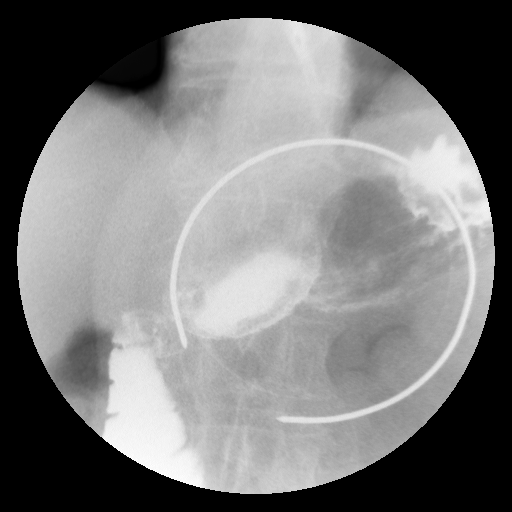

[run (2 of 17)]
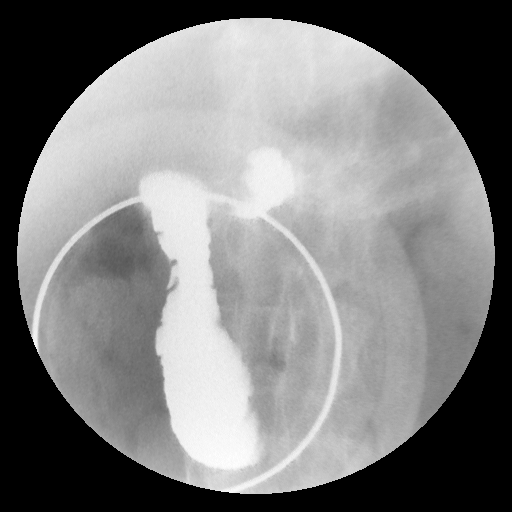

[run (3 of 17)]
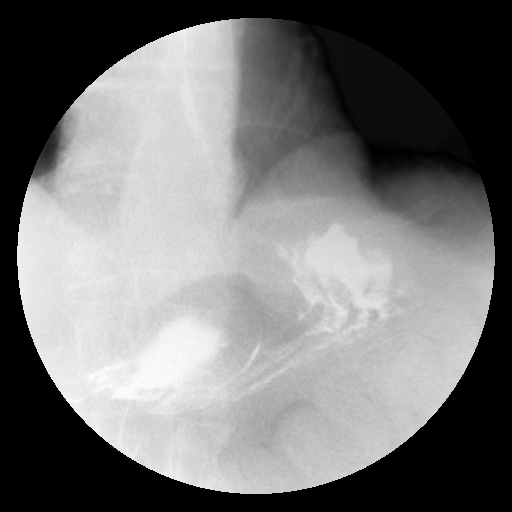

[run (4 of 17)]
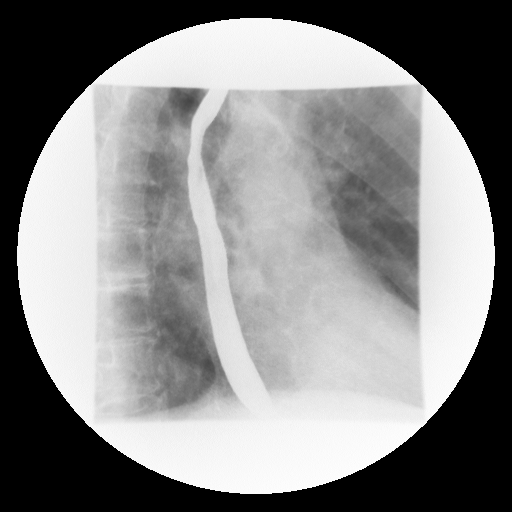

[run (5 of 17)]
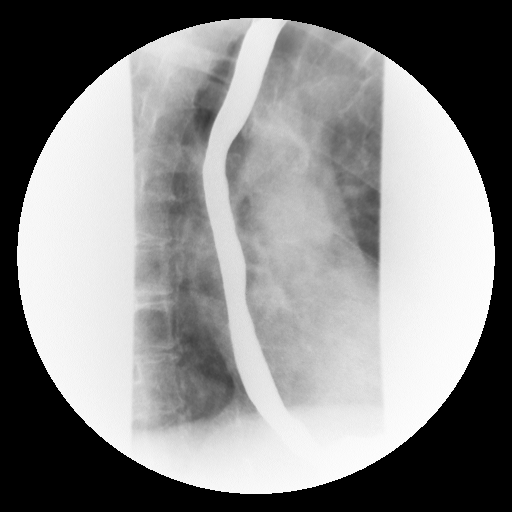

[run (6 of 17)]
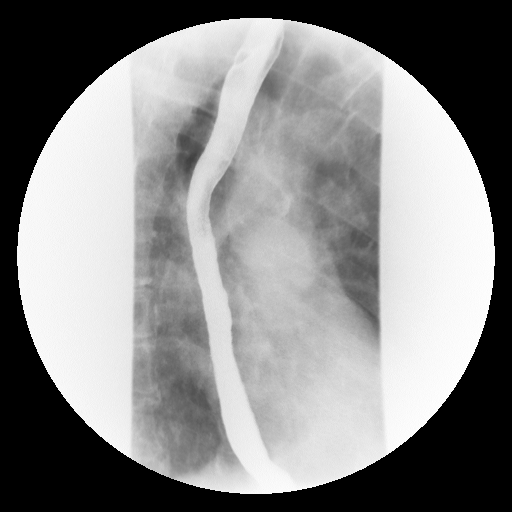

[run (7 of 17)]
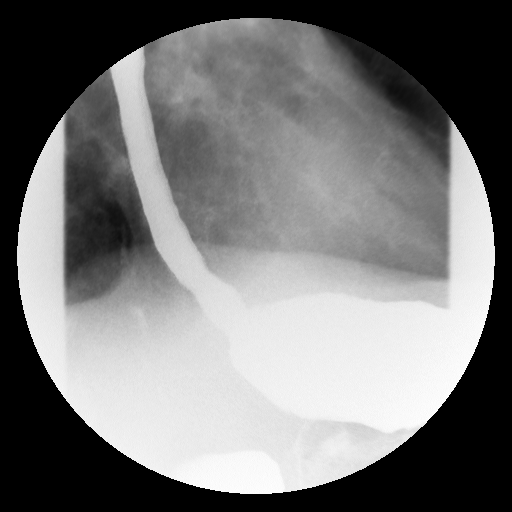

[run (8 of 17)]
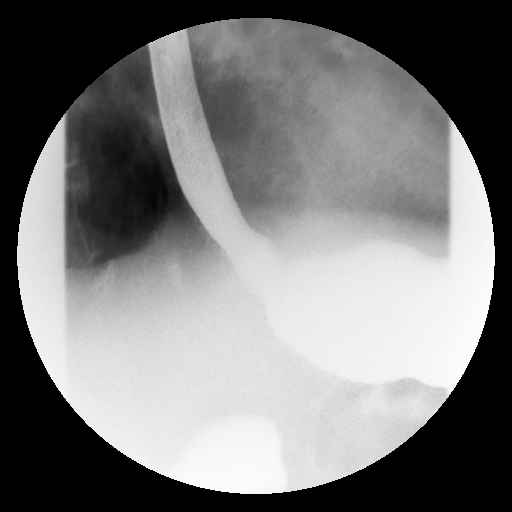

[run (9 of 17)]
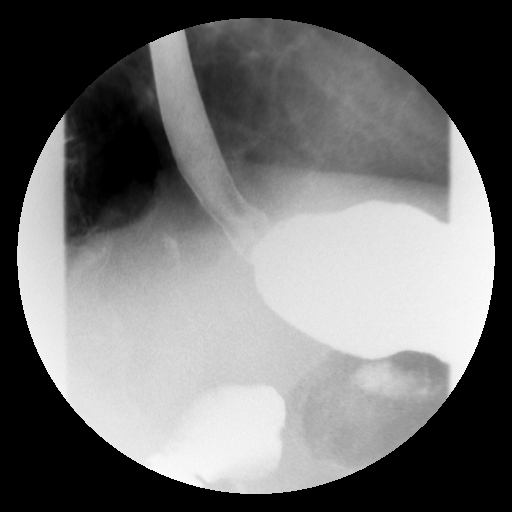

[run (10 of 17)]
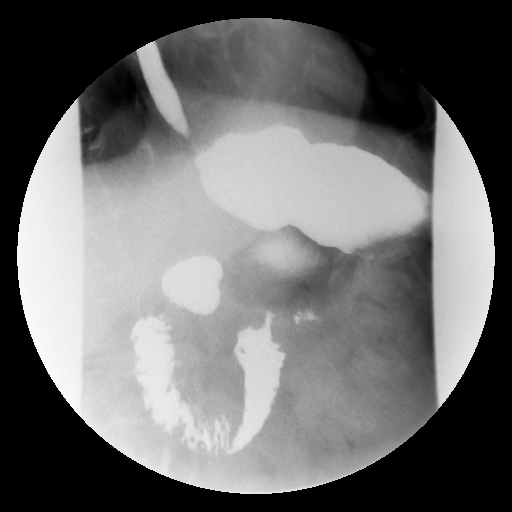

[run (11 of 17)]
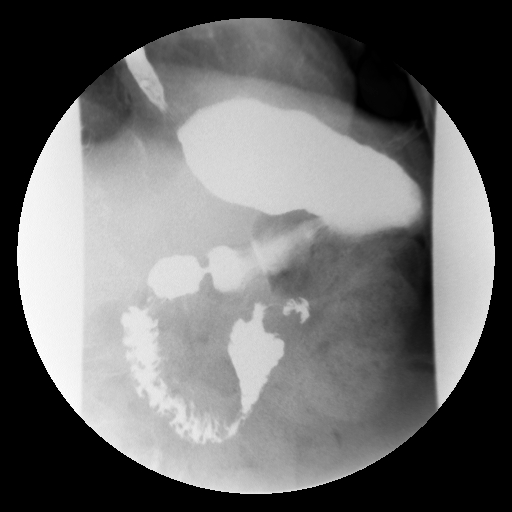

[run (12 of 17)]
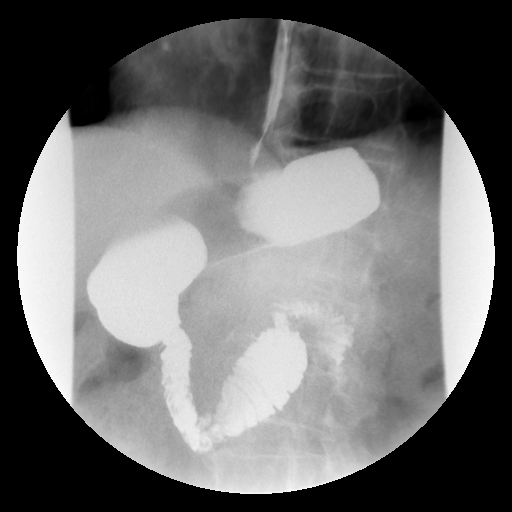

[run (13 of 17)]
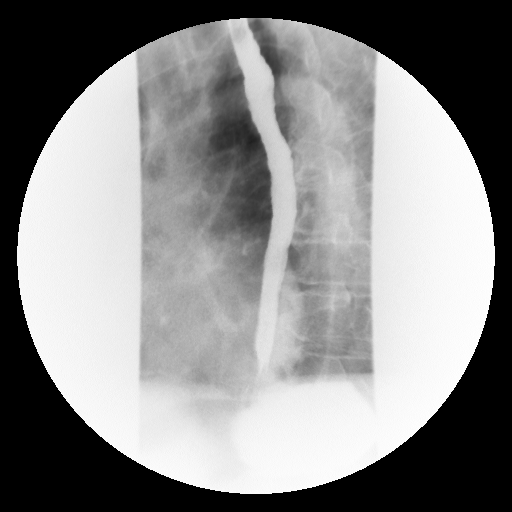

[run (14 of 17)]
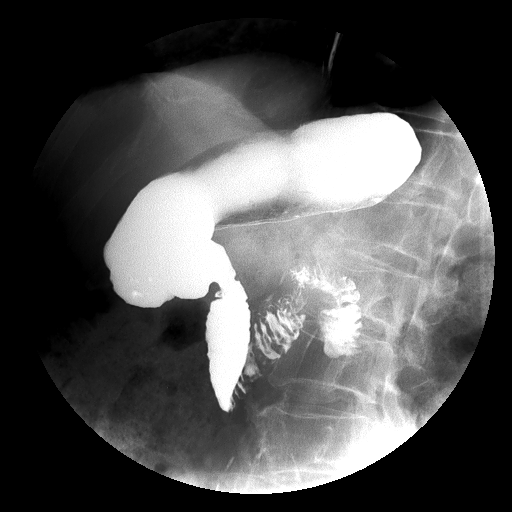

[run (15 of 17)]
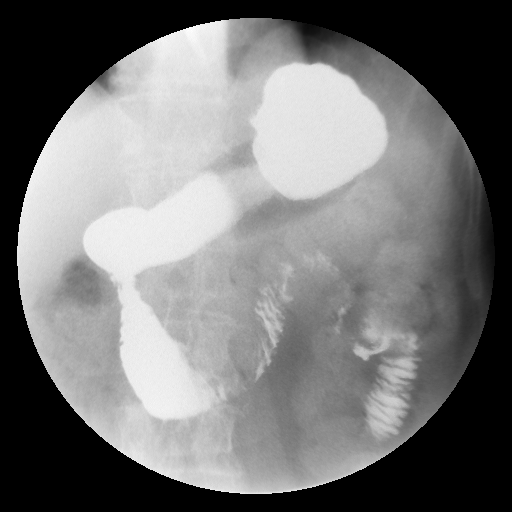

[run (16 of 17)]
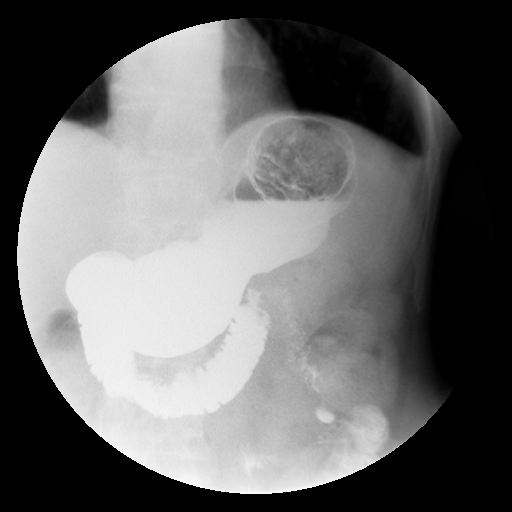

[run (17 of 17)]
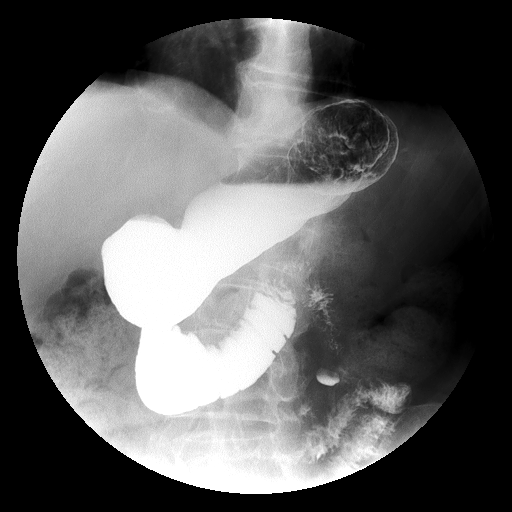

[view not recorded]
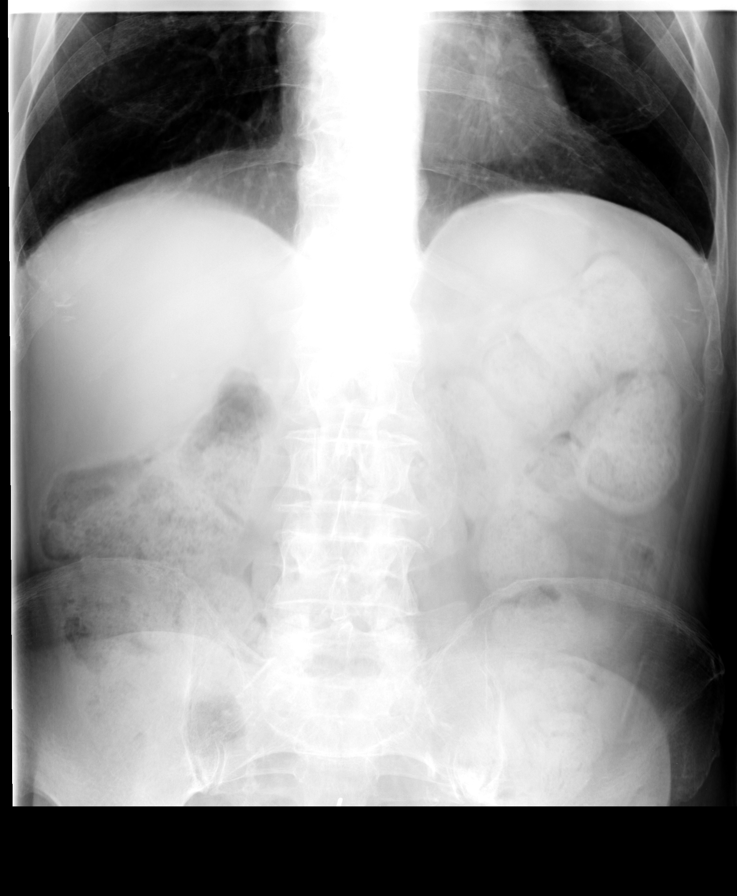

[18 of 21 positions shown; findings below may reference images not displayed]

FINDINGS: Initial barium swallows demonstrate esophageal dysmotility with
occasional disruption of the primary peristaltic wave an occasional
tertiary contractions. No intrinsic or extrinsic lesions are
identified. No hiatal hernia or gastroesophageal reflux.

The stomach demonstrates slow emptying and no discernable gastric
contractions possibly suggesting gastroparesis. There is also mild
dilatation of the duodenum to the level of the duodenum jejunal
junction with focal narrowing of the duodenum. Reviewing the prior
CT scan this is likely due to a nutcracker phenomenon with
compression of the duodenum between the aortic aneurysm and SMA. No
stomach mass or duodenum lesion is identified.
IMPRESSION: 1. Nonspecific esophageal dysmotility. No intrinsic or extrinsic
esophageal lesions.
2. Poorly emptying stomach suggesting gastroparesis.
3. Mild dilatation of the proximal duodenum and narrowing of the 3rd
portion. Correlating with the recent CT scan I think this is likely
due to a nutcracker phenomenon with compression of the 3rd portion
of the duodenum between the aortic aneurysm and SMA
